# Patient Record
Sex: Female | Born: 2000 | Hispanic: No | Marital: Single | Smoking: Never smoker
Health system: Southern US, Community
[De-identification: ages and names within clinical notes are randomized; demographics above are authoritative.]

## PROBLEM LIST (undated history)

## (undated) DIAGNOSIS — Z789 Other specified health status: Secondary | ICD-10-CM

## (undated) HISTORY — PX: WISDOM TOOTH EXTRACTION: SHX21

---
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
## 2020-03-18 DIAGNOSIS — F1211 Cannabis abuse, in remission: Secondary | ICD-10-CM | POA: Diagnosis not present

## 2020-03-26 NOTE — L&D Delivery Note (Signed)
OB/GYN Faculty Practice Delivery Note  Carla Jones is a 20 y.o. G1P1001 s/p SVD at [redacted]w[redacted]d. She was admitted for IOL due to DFM.   ROM: 10h 39m with clear fluid GBS Status: Negative   Delivery Date/Time: 01/17/21 at 2300  Delivery: Called to room and patient was complete and pushing. Head delivered ROA. Nuchal cord present and reduced at the perineum. A shoulder dystocia was encountered and no additional traction was placed on the infant's head. McRobert's maneuver and suprapubic pressure were performed followed by rotational maneuvers which allowed the anterior shoulder to be released. Shoulders and body subsequently delivered in usual fashion. Infant placed on mother's abdomen, dried and stimulated. Cord clamped x 2 after 30 second delay, and cut by FOB under my direct supervision. Cord blood drawn. Placenta delivered spontaneously with gentle cord traction. Fundus firm with massage and Pitocin. Labia, perineum, vagina, and cervix were inspected, and patient was found to have small vaginal abrasions that were hemostatic and not repaired.   Placenta: Intact; 3VC - sent to L&D Complications: Shoulder dystocia Lacerations: Vaginal abrasions EBL: 575 cc Analgesia: Epidural  Infant: Viable female  APGARs 5 and 8  Evalina Field, MD OB/GYN Fellow, Faculty Practice

## 2020-04-01 DIAGNOSIS — F1211 Cannabis abuse, in remission: Secondary | ICD-10-CM | POA: Diagnosis not present

## 2020-04-05 DIAGNOSIS — R300 Dysuria: Secondary | ICD-10-CM | POA: Diagnosis not present

## 2020-04-09 DIAGNOSIS — F1211 Cannabis abuse, in remission: Secondary | ICD-10-CM | POA: Diagnosis not present

## 2020-05-04 DIAGNOSIS — Y999 Unspecified external cause status: Secondary | ICD-10-CM | POA: Diagnosis not present

## 2020-05-04 DIAGNOSIS — S20212A Contusion of left front wall of thorax, initial encounter: Secondary | ICD-10-CM | POA: Diagnosis not present

## 2020-05-04 DIAGNOSIS — R0781 Pleurodynia: Secondary | ICD-10-CM | POA: Diagnosis not present

## 2020-05-04 DIAGNOSIS — Z3202 Encounter for pregnancy test, result negative: Secondary | ICD-10-CM | POA: Diagnosis not present

## 2020-05-04 DIAGNOSIS — M546 Pain in thoracic spine: Secondary | ICD-10-CM | POA: Diagnosis not present

## 2020-05-04 DIAGNOSIS — W182XXA Fall in (into) shower or empty bathtub, initial encounter: Secondary | ICD-10-CM | POA: Diagnosis not present

## 2020-05-07 DIAGNOSIS — F1211 Cannabis abuse, in remission: Secondary | ICD-10-CM | POA: Diagnosis not present

## 2020-05-13 DIAGNOSIS — F1211 Cannabis abuse, in remission: Secondary | ICD-10-CM | POA: Diagnosis not present

## 2020-05-24 DIAGNOSIS — R102 Pelvic and perineal pain: Secondary | ICD-10-CM | POA: Diagnosis not present

## 2020-05-24 DIAGNOSIS — R319 Hematuria, unspecified: Secondary | ICD-10-CM | POA: Diagnosis not present

## 2020-05-24 DIAGNOSIS — M549 Dorsalgia, unspecified: Secondary | ICD-10-CM | POA: Diagnosis not present

## 2020-05-26 DIAGNOSIS — R55 Syncope and collapse: Secondary | ICD-10-CM | POA: Diagnosis not present

## 2020-05-26 DIAGNOSIS — O26891 Other specified pregnancy related conditions, first trimester: Secondary | ICD-10-CM | POA: Diagnosis not present

## 2020-05-26 DIAGNOSIS — R197 Diarrhea, unspecified: Secondary | ICD-10-CM | POA: Diagnosis not present

## 2020-05-26 DIAGNOSIS — O219 Vomiting of pregnancy, unspecified: Secondary | ICD-10-CM | POA: Diagnosis not present

## 2020-05-26 DIAGNOSIS — Z20822 Contact with and (suspected) exposure to covid-19: Secondary | ICD-10-CM | POA: Diagnosis not present

## 2020-05-26 DIAGNOSIS — E860 Dehydration: Secondary | ICD-10-CM | POA: Diagnosis not present

## 2020-05-26 DIAGNOSIS — R1030 Lower abdominal pain, unspecified: Secondary | ICD-10-CM | POA: Diagnosis not present

## 2020-05-26 DIAGNOSIS — E876 Hypokalemia: Secondary | ICD-10-CM | POA: Diagnosis not present

## 2020-05-26 DIAGNOSIS — O99281 Endocrine, nutritional and metabolic diseases complicating pregnancy, first trimester: Secondary | ICD-10-CM | POA: Diagnosis not present

## 2020-05-26 DIAGNOSIS — Z3A01 Less than 8 weeks gestation of pregnancy: Secondary | ICD-10-CM | POA: Diagnosis not present

## 2020-06-01 DIAGNOSIS — N912 Amenorrhea, unspecified: Secondary | ICD-10-CM | POA: Diagnosis not present

## 2020-06-01 DIAGNOSIS — R102 Pelvic and perineal pain: Secondary | ICD-10-CM | POA: Diagnosis not present

## 2020-06-01 DIAGNOSIS — Z3201 Encounter for pregnancy test, result positive: Secondary | ICD-10-CM | POA: Diagnosis not present

## 2020-06-03 DIAGNOSIS — F1211 Cannabis abuse, in remission: Secondary | ICD-10-CM | POA: Diagnosis not present

## 2020-06-11 DIAGNOSIS — F1211 Cannabis abuse, in remission: Secondary | ICD-10-CM | POA: Diagnosis not present

## 2020-06-16 DIAGNOSIS — O26841 Uterine size-date discrepancy, first trimester: Secondary | ICD-10-CM | POA: Diagnosis not present

## 2020-06-16 DIAGNOSIS — Z3401 Encounter for supervision of normal first pregnancy, first trimester: Secondary | ICD-10-CM | POA: Diagnosis not present

## 2020-06-16 DIAGNOSIS — Z3490 Encounter for supervision of normal pregnancy, unspecified, unspecified trimester: Secondary | ICD-10-CM | POA: Diagnosis not present

## 2020-06-16 DIAGNOSIS — Z3A09 9 weeks gestation of pregnancy: Secondary | ICD-10-CM | POA: Diagnosis not present

## 2020-06-17 DIAGNOSIS — F1211 Cannabis abuse, in remission: Secondary | ICD-10-CM | POA: Diagnosis not present

## 2020-06-24 DIAGNOSIS — F1211 Cannabis abuse, in remission: Secondary | ICD-10-CM | POA: Diagnosis not present

## 2020-06-28 DIAGNOSIS — Z5181 Encounter for therapeutic drug level monitoring: Secondary | ICD-10-CM | POA: Diagnosis not present

## 2020-07-01 ENCOUNTER — Emergency Department (HOSPITAL_COMMUNITY)
Admission: EM | Admit: 2020-07-01 | Discharge: 2020-07-01 | Disposition: A | Discharge status: Home / Self Care | Payer: Medicaid Other | Attending: Emergency Medicine | Admitting: Emergency Medicine

## 2020-07-01 ENCOUNTER — Encounter (HOSPITAL_COMMUNITY): Payer: Self-pay

## 2020-07-01 ENCOUNTER — Other Ambulatory Visit: Payer: Self-pay

## 2020-07-01 DIAGNOSIS — O99281 Endocrine, nutritional and metabolic diseases complicating pregnancy, first trimester: Secondary | ICD-10-CM | POA: Insufficient documentation

## 2020-07-01 DIAGNOSIS — E876 Hypokalemia: Secondary | ICD-10-CM | POA: Diagnosis not present

## 2020-07-01 DIAGNOSIS — E860 Dehydration: Secondary | ICD-10-CM | POA: Insufficient documentation

## 2020-07-01 DIAGNOSIS — R000 Tachycardia, unspecified: Secondary | ICD-10-CM | POA: Diagnosis not present

## 2020-07-01 DIAGNOSIS — O210 Mild hyperemesis gravidarum: Secondary | ICD-10-CM | POA: Diagnosis not present

## 2020-07-01 DIAGNOSIS — Z3A11 11 weeks gestation of pregnancy: Secondary | ICD-10-CM | POA: Diagnosis not present

## 2020-07-01 DIAGNOSIS — O219 Vomiting of pregnancy, unspecified: Secondary | ICD-10-CM | POA: Diagnosis not present

## 2020-07-01 LAB — URINALYSIS, ROUTINE W REFLEX MICROSCOPIC
Bilirubin Urine: NEGATIVE
Glucose, UA: NEGATIVE mg/dL
Hgb urine dipstick: NEGATIVE
Ketones, ur: 80 mg/dL — AB
Leukocytes,Ua: NEGATIVE
Nitrite: NEGATIVE
Protein, ur: NEGATIVE mg/dL
Specific Gravity, Urine: 1.024 (ref 1.005–1.030)
pH: 7 (ref 5.0–8.0)

## 2020-07-01 LAB — COMPREHENSIVE METABOLIC PANEL
ALT: 19 U/L (ref 0–44)
AST: 19 U/L (ref 15–41)
Albumin: 4.4 g/dL (ref 3.5–5.0)
Alkaline Phosphatase: 36 U/L — ABNORMAL LOW (ref 38–126)
Anion gap: 12 (ref 5–15)
BUN: 6 mg/dL (ref 6–20)
CO2: 18 mmol/L — ABNORMAL LOW (ref 22–32)
Calcium: 9.7 mg/dL (ref 8.9–10.3)
Chloride: 103 mmol/L (ref 98–111)
Creatinine, Ser: 0.68 mg/dL (ref 0.44–1.00)
GFR, Estimated: >60 mL/min (ref >60)
Glucose, Bld: 88 mg/dL (ref 70–99)
Potassium: 2.9 mmol/L — ABNORMAL LOW (ref 3.5–5.1)
Sodium: 133 mmol/L — ABNORMAL LOW (ref 135–145)
Total Bilirubin: 0.6 mg/dL (ref 0.3–1.2)
Total Protein: 8.2 g/dL — ABNORMAL HIGH (ref 6.5–8.1)

## 2020-07-01 LAB — RAPID URINE DRUG SCREEN, HOSP PERFORMED
Amphetamines: NOT DETECTED
Barbiturates: NOT DETECTED
Benzodiazepines: NOT DETECTED
Cocaine: NOT DETECTED
Opiates: NOT DETECTED
Tetrahydrocannabinol: POSITIVE — AB

## 2020-07-01 LAB — CBC WITH DIFFERENTIAL/PLATELET
Abs Immature Granulocytes: 0.02 10*3/uL (ref 0.00–0.07)
Basophils Absolute: 0 10*3/uL (ref 0.0–0.1)
Basophils Relative: 1 %
Eosinophils Absolute: 0 10*3/uL (ref 0.0–0.5)
Eosinophils Relative: 0 %
HCT: 37.9 % (ref 36.0–46.0)
Hemoglobin: 12.9 g/dL (ref 12.0–15.0)
Immature Granulocytes: 0 %
Lymphocytes Relative: 8 %
Lymphs Abs: 0.5 10*3/uL — ABNORMAL LOW (ref 0.7–4.0)
MCH: 30.4 pg (ref 26.0–34.0)
MCHC: 34 g/dL (ref 30.0–36.0)
MCV: 89.4 fL (ref 80.0–100.0)
Monocytes Absolute: 0.4 10*3/uL (ref 0.1–1.0)
Monocytes Relative: 6 %
Neutro Abs: 5.4 10*3/uL (ref 1.7–7.7)
Neutrophils Relative %: 85 %
Platelets: 211 10*3/uL (ref 150–400)
RBC: 4.24 MIL/uL (ref 3.87–5.11)
RDW: 12.7 % (ref 11.5–15.5)
WBC: 6.4 10*3/uL (ref 4.0–10.5)
nRBC: 0 % (ref 0.0–0.2)

## 2020-07-01 LAB — LIPASE, BLOOD: Lipase: 24 U/L (ref 11–51)

## 2020-07-01 MED ORDER — POTASSIUM CHLORIDE CRYS ER 20 MEQ PO TBCR
40.0000 meq | EXTENDED_RELEASE_TABLET | Freq: Once | ORAL | Status: AC
  Administered 2020-07-01: 40 meq via ORAL
  Filled 2020-07-01: qty 2

## 2020-07-01 MED ORDER — DIPHENHYDRAMINE HCL 50 MG/ML IJ SOLN
25.0000 mg | Freq: Once | INTRAMUSCULAR | Status: AC
  Administered 2020-07-01: 25 mg via INTRAVENOUS
  Filled 2020-07-01: qty 1

## 2020-07-01 MED ORDER — LACTATED RINGERS IV BOLUS
1000.0000 mL | Freq: Once | INTRAVENOUS | Status: AC
  Administered 2020-07-01: 1000 mL via INTRAVENOUS

## 2020-07-01 MED ORDER — SODIUM CHLORIDE 0.9 % IV BOLUS: 1000.0000 mL | Freq: Once | INTRAVENOUS | Status: DC

## 2020-07-01 MED ORDER — METOCLOPRAMIDE HCL 5 MG/ML IJ SOLN
10.0000 mg | Freq: Once | INTRAMUSCULAR | Status: AC
  Administered 2020-07-01: 10 mg via INTRAVENOUS
  Filled 2020-07-01: qty 2

## 2020-07-01 MED ORDER — ONDANSETRON HCL 4 MG/2ML IJ SOLN
4.0000 mg | Freq: Once | INTRAMUSCULAR | Status: AC
  Administered 2020-07-01: 4 mg via INTRAVENOUS
  Filled 2020-07-01: qty 2

## 2020-07-01 MED ORDER — POTASSIUM CHLORIDE 10 MEQ/100ML IV SOLN
10.0000 meq | INTRAVENOUS | Status: AC
  Administered 2020-07-01: 10 meq via INTRAVENOUS
  Filled 2020-07-01: qty 100

## 2020-07-01 MED ORDER — METOCLOPRAMIDE HCL 10 MG PO TABS: 10.0000 mg | ORAL_TABLET | Freq: Four times a day (QID) | ORAL | 0 refills | Status: DC | PRN

## 2020-07-01 NOTE — Discharge Instructions (Signed)
Your potassium is low likely from vomiting.  Please stay hydrated.  Take Reglan as needed for vomiting  Take your prenatal vitamins.   See your OB doctor  Return to the ER if you have worse vomiting, abdominal pain, vaginal bleeding.

## 2020-07-01 NOTE — ED Triage Notes (Signed)
Coming from home, [redacted] weeks pregnant, vomiting for days

## 2020-07-01 NOTE — ED Provider Notes (Signed)
[facility]-EMERGENCY DEPT Provider Note   CSN: [id number] Arrival date & time: 07/01/20  1853     History Chief Complaint  Patient presents with  . Emesis    Tenlee Tyeshia Cornforth is a 20 y.o. female about [redacted] weeks pregnant here presenting with vomiting.  Patient started vomiting for the last several days.  Patient denies any vaginal bleeding or discharge.  Patient was seen by OB recently had an ultrasound that was unremarkable.  Patient actually has been having intermittent vomiting for the last several months and was in the process of seeing a GI doctor.  Patient is not currently on any medicines for vomiting  The history is provided by the patient.       History reviewed. No pertinent past medical history.  There are no problems to display for this patient.   History reviewed. No pertinent surgical history.   OB History    Gravida  1   Para      Term      Preterm      AB      Living        SAB      IAB      Ectopic      Multiple      Live Births              No family history on file.  Social History   Tobacco Use  . Smoking status: Never Smoker  . Smokeless tobacco: Never Used  Substance Use Topics  . Alcohol use: Never  . Drug use: Never    Home Medications Prior to Admission medications   Not on File    Allergies    Patient has no known allergies.  Review of Systems   Review of Systems  Gastrointestinal: Positive for vomiting.  All other systems reviewed and are negative.   Physical Exam Updated Vital Signs BP 122/69   Pulse (!) 102   Temp 98 F (36.7 C) (Oral)   Resp 16   Ht 5\' 5"  (1.651 m)   Wt 57.2 kg   LMP 10/26/2019   SpO2 100%   BMI 20.97 kg/m   Physical Exam Vitals and nursing note reviewed.  Constitutional:      Comments: Vomiting, dehydrated   HENT:     Head: Normocephalic.     Nose: Nose normal.     Mouth/Throat:     Mouth: Mucous membranes are dry.  Eyes:     Extraocular  Movements: Extraocular movements intact.     Pupils: Pupils are equal, round, and reactive to light.  Cardiovascular:     Rate and Rhythm: Normal rate and regular rhythm.     Pulses: Normal pulses.     Heart sounds: Normal heart sounds.  Pulmonary:     Effort: Pulmonary effort is normal.     Breath sounds: Normal breath sounds.  Abdominal:     General: Abdomen is flat.     Palpations: Abdomen is soft.  Musculoskeletal:        General: Normal range of motion.     Cervical back: Normal range of motion.  Skin:    General: Skin is warm.     Capillary Refill: Capillary refill takes less than 2 seconds.  Neurological:     General: No focal deficit present.     Mental Status: She is oriented to person, place, and time.  Psychiatric:        Mood and Affect: Mood normal.  Behavior: Behavior normal.     ED Results / Procedures / Treatments   Labs (all labs ordered are listed, but only abnormal results are displayed) Labs Reviewed  CBC WITH DIFFERENTIAL/PLATELET  COMPREHENSIVE METABOLIC PANEL  LIPASE, BLOOD    EKG EKG Interpretation  Date/Time:  Friday July 01 2020 19:08:25 EDT Ventricular Rate:  110 PR Interval:  103 QRS Duration: 78 QT Interval:  332 QTC Calculation: 450 R Axis:   54 Text Interpretation: Sinus tachycardia Borderline repolarization abnormality Since last tracing rate faster Confirmed by Richardean Canal [phone]) on 07/01/2020 7:10:51 PM   Radiology No results found.  Procedures Procedures   Medications Ordered in ED Medications  lactated ringers bolus 1,000 mL (has no administration in time range)  ondansetron (ZOFRAN) injection 4 mg (has no administration in time range)    ED Course  I have reviewed the triage vital signs and the nursing notes.  Pertinent labs & imaging results that were available during my care of the patient were reviewed by me and considered in my medical decision making (see chart for details).    MDM  Rules/Calculators/A&P                         Mackayla Rafeef Lau is a 20 y.o. female who presented with vomiting.  Patient is [redacted] weeks pregnant.  Patient likely has hyperemesis gravidarum.  Patient also has chronic vomiting so I wonder if he has cyclic vomiting syndrome  11:04 PM Patient potassium is 2.9 and supplemented.  Patient's UA showed ketones and also marijuana.  Patient is able to tolerate p.o. after getting Reglan.  Told her to stay hydrated and take Reglan as needed and follow-up with her OB doctor.    Final Clinical Impression(s) / ED Diagnoses Final diagnoses:  None    Rx / DC Orders ED Discharge Orders    None       Charlynne Pander, MD 07/01/20 2305

## 2020-07-04 ENCOUNTER — Inpatient Hospital Stay (HOSPITAL_COMMUNITY)
Admission: AD | Admit: 2020-07-04 | Discharge: 2020-07-04 | Disposition: A | Discharge status: Home / Self Care | Payer: Medicaid Other | Attending: Family Medicine | Admitting: Family Medicine

## 2020-07-04 ENCOUNTER — Encounter (HOSPITAL_COMMUNITY): Payer: Self-pay | Admitting: Family Medicine

## 2020-07-04 ENCOUNTER — Other Ambulatory Visit: Payer: Self-pay

## 2020-07-04 DIAGNOSIS — Z049 Encounter for examination and observation for unspecified reason: Secondary | ICD-10-CM | POA: Diagnosis present

## 2020-07-04 DIAGNOSIS — Z20822 Contact with and (suspected) exposure to covid-19: Secondary | ICD-10-CM | POA: Diagnosis not present

## 2020-07-04 DIAGNOSIS — O26891 Other specified pregnancy related conditions, first trimester: Secondary | ICD-10-CM | POA: Diagnosis not present

## 2020-07-04 DIAGNOSIS — Z3A12 12 weeks gestation of pregnancy: Secondary | ICD-10-CM | POA: Diagnosis not present

## 2020-07-04 DIAGNOSIS — R0981 Nasal congestion: Secondary | ICD-10-CM | POA: Insufficient documentation

## 2020-07-04 LAB — SARS CORONAVIRUS 2 (TAT 6-24 HRS): SARS Coronavirus 2: NEGATIVE

## 2020-07-04 NOTE — MAU Note (Signed)
Pt will be called with positive results

## 2020-07-04 NOTE — MAU Provider Note (Signed)
Event Date/Time   First Provider Initiated Contact with Patient 07/04/20 1249      S Carla Jones is a 20 y.o. G1P0 patient who presents to MAU today with complaint of nasal congestion and sore throat. She is concerned about COVID.   O BP 139/78 (BP Location: Right Arm)   Pulse (!) 103   Temp 98.3 F (36.8 C) (Oral)   Resp 17   Ht 5\' 5"  (1.651 m)   Wt 56.2 kg   LMP 10/26/2019   SpO2 100%   BMI 20.62 kg/m  Physical Exam Vitals reviewed.  Constitutional:      Appearance: She is well-developed.  Cardiovascular:     Rate and Rhythm: Normal rate and regular rhythm.     Heart sounds: Normal heart sounds.  Pulmonary:     Effort: Pulmonary effort is normal.     Breath sounds: Normal breath sounds.  Abdominal:     Palpations: Abdomen is soft.  Neurological:     Mental Status: She is alert.  Psychiatric:        Mood and Affect: Mood normal.        Speech: Speech normal.        Behavior: Behavior normal.     A Medical screening exam complete Nasal congestion  P Check COVID.  Flonase and mucinex DM. Discharge from MAU in stable condition Warning signs for worsening condition that would warrant emergency follow-up discussed Patient may return to MAU as needed   12/26/2019, DO 07/04/2020 12:52 PM

## 2020-07-04 NOTE — MAU Note (Signed)
?   Flu symptoms, just wanted to come in and check. Cough, runny nose, headache, body aches, sore throat, fever(did not check). Loss of appetite, chills.  Started 2 days ago, started with HA when leaving the hospital (seen for n/v).Denies loss of taste or sense of smell.  Neg home covid test yesteday.. has taken Tylenol sinus, robitussin, and regular tylenol

## 2020-07-04 NOTE — Discharge Instructions (Signed)
Start flonase and mucinex DM We will notify you of + COVID.  Thank you for enrolling in MyChart. Please follow the instructions below to securely access your online medical record. MyChart allows you to send messages to your doctor, view your test results, manage appointments, and more.   How Do I Sign Up? 1. In your Internet browser, go to [employer] and enter [email or web address]. 2. Click on the Sign Up Now link in the Sign In box. You will see the New Member Sign Up page. 3. Enter your MyChart Access Code exactly as it appears below. You will not need to use this code after you've completed the sign-up process. If you do not sign up before the expiration date, you must request a new code.  MyChart Access Code: [email or web address] Expires: 08/15/2020 11:07 PM  4. Enter your Social Security Number ([id number]) and Date of Birth (mm/dd/yyyy) as indicated and click Submit. You will be taken to the next sign-up page. 5. Create a MyChart ID. This will be your MyChart login ID and cannot be changed, so think of one that is secure and easy to remember. 6. Create a MyChart password. You can change your password at any time. 7. Enter your Password Reset Question and Answer. This can be used at a later time if you forget your password.  8. Enter your e-mail address. You will receive e-mail notification when new information is available in MyChart. 9. Click Sign Up. You can now view your medical record.   Additional Information Remember, MyChart is NOT to be used for urgent needs. For medical emergencies, dial 911.

## 2020-07-08 DIAGNOSIS — F1211 Cannabis abuse, in remission: Secondary | ICD-10-CM | POA: Diagnosis not present

## 2020-07-14 DIAGNOSIS — Z3402 Encounter for supervision of normal first pregnancy, second trimester: Secondary | ICD-10-CM | POA: Diagnosis not present

## 2020-07-15 DIAGNOSIS — F1211 Cannabis abuse, in remission: Secondary | ICD-10-CM | POA: Diagnosis not present

## 2020-07-26 DIAGNOSIS — Z5181 Encounter for therapeutic drug level monitoring: Secondary | ICD-10-CM | POA: Diagnosis not present

## 2020-07-29 DIAGNOSIS — F1211 Cannabis abuse, in remission: Secondary | ICD-10-CM | POA: Diagnosis not present

## 2020-08-08 DIAGNOSIS — Z3402 Encounter for supervision of normal first pregnancy, second trimester: Secondary | ICD-10-CM | POA: Diagnosis not present

## 2020-08-08 DIAGNOSIS — Z5181 Encounter for therapeutic drug level monitoring: Secondary | ICD-10-CM | POA: Diagnosis not present

## 2020-08-23 DIAGNOSIS — Z5181 Encounter for therapeutic drug level monitoring: Secondary | ICD-10-CM | POA: Diagnosis not present

## 2020-08-25 DIAGNOSIS — Z5181 Encounter for therapeutic drug level monitoring: Secondary | ICD-10-CM | POA: Diagnosis not present

## 2020-09-01 DIAGNOSIS — Z3A20 20 weeks gestation of pregnancy: Secondary | ICD-10-CM | POA: Diagnosis not present

## 2020-09-01 DIAGNOSIS — Z3402 Encounter for supervision of normal first pregnancy, second trimester: Secondary | ICD-10-CM | POA: Diagnosis not present

## 2020-09-08 DIAGNOSIS — Z5181 Encounter for therapeutic drug level monitoring: Secondary | ICD-10-CM | POA: Diagnosis not present

## 2020-09-16 DIAGNOSIS — F1211 Cannabis abuse, in remission: Secondary | ICD-10-CM | POA: Diagnosis not present

## 2020-10-07 DIAGNOSIS — F1211 Cannabis abuse, in remission: Secondary | ICD-10-CM | POA: Diagnosis not present

## 2020-10-21 DIAGNOSIS — F1211 Cannabis abuse, in remission: Secondary | ICD-10-CM | POA: Diagnosis not present

## 2020-10-27 DIAGNOSIS — Z3403 Encounter for supervision of normal first pregnancy, third trimester: Secondary | ICD-10-CM | POA: Diagnosis not present

## 2020-10-27 DIAGNOSIS — Z3402 Encounter for supervision of normal first pregnancy, second trimester: Secondary | ICD-10-CM | POA: Diagnosis not present

## 2020-10-27 DIAGNOSIS — Z3A28 28 weeks gestation of pregnancy: Secondary | ICD-10-CM | POA: Diagnosis not present

## 2020-10-31 ENCOUNTER — Other Ambulatory Visit: Payer: Self-pay

## 2020-10-31 ENCOUNTER — Inpatient Hospital Stay (HOSPITAL_COMMUNITY): Payer: Medicaid Other

## 2020-10-31 ENCOUNTER — Inpatient Hospital Stay (HOSPITAL_COMMUNITY)
Admission: AD | Admit: 2020-10-31 | Discharge: 2020-10-31 | Disposition: A | Discharge status: Home / Self Care | Payer: Medicaid Other | Attending: Family Medicine | Admitting: Family Medicine

## 2020-10-31 ENCOUNTER — Encounter (HOSPITAL_COMMUNITY): Payer: Self-pay | Admitting: Obstetrics & Gynecology

## 2020-10-31 DIAGNOSIS — O98513 Other viral diseases complicating pregnancy, third trimester: Secondary | ICD-10-CM | POA: Diagnosis not present

## 2020-10-31 DIAGNOSIS — U071 COVID-19: Secondary | ICD-10-CM | POA: Diagnosis not present

## 2020-10-31 DIAGNOSIS — J1282 Pneumonia due to coronavirus disease 2019: Secondary | ICD-10-CM | POA: Insufficient documentation

## 2020-10-31 DIAGNOSIS — R0602 Shortness of breath: Secondary | ICD-10-CM | POA: Diagnosis not present

## 2020-10-31 DIAGNOSIS — O212 Late vomiting of pregnancy: Secondary | ICD-10-CM | POA: Insufficient documentation

## 2020-10-31 DIAGNOSIS — O288 Other abnormal findings on antenatal screening of mother: Secondary | ICD-10-CM

## 2020-10-31 DIAGNOSIS — Z3A29 29 weeks gestation of pregnancy: Secondary | ICD-10-CM

## 2020-10-31 HISTORY — DX: Other specified health status: Z78.9

## 2020-10-31 LAB — URINALYSIS, ROUTINE W REFLEX MICROSCOPIC
Bilirubin Urine: NEGATIVE
Glucose, UA: NEGATIVE mg/dL
Hgb urine dipstick: NEGATIVE
Ketones, ur: 80 mg/dL — AB
Leukocytes,Ua: NEGATIVE
Nitrite: NEGATIVE
Protein, ur: NEGATIVE mg/dL
Specific Gravity, Urine: 1.02 (ref 1.005–1.030)
pH: 9 — ABNORMAL HIGH (ref 5.0–8.0)

## 2020-10-31 LAB — CBC
HCT: 33.5 % — ABNORMAL LOW (ref 36.0–46.0)
Hemoglobin: 10.9 g/dL — ABNORMAL LOW (ref 12.0–15.0)
MCH: 29.1 pg (ref 26.0–34.0)
MCHC: 32.5 g/dL (ref 30.0–36.0)
MCV: 89.6 fL (ref 80.0–100.0)
Platelets: 154 10*3/uL (ref 150–400)
RBC: 3.74 MIL/uL — ABNORMAL LOW (ref 3.87–5.11)
RDW: 12.4 % (ref 11.5–15.5)
WBC: 9.7 10*3/uL (ref 4.0–10.5)
nRBC: 0 % (ref 0.0–0.2)

## 2020-10-31 LAB — COMPREHENSIVE METABOLIC PANEL
ALT: 14 U/L (ref 0–44)
AST: 20 U/L (ref 15–41)
Albumin: 3.1 g/dL — ABNORMAL LOW (ref 3.5–5.0)
Alkaline Phosphatase: 59 U/L (ref 38–126)
Anion gap: 10 (ref 5–15)
BUN: 5 mg/dL — ABNORMAL LOW (ref 6–20)
CO2: 20 mmol/L — ABNORMAL LOW (ref 22–32)
Calcium: 9.2 mg/dL (ref 8.9–10.3)
Chloride: 105 mmol/L (ref 98–111)
Creatinine, Ser: 0.61 mg/dL (ref 0.44–1.00)
GFR, Estimated: >60 mL/min (ref >60)
Glucose, Bld: 84 mg/dL (ref 70–99)
Potassium: 3.5 mmol/L (ref 3.5–5.1)
Sodium: 135 mmol/L (ref 135–145)
Total Bilirubin: 0.6 mg/dL (ref 0.3–1.2)
Total Protein: 6.8 g/dL (ref 6.5–8.1)

## 2020-10-31 LAB — RESP PANEL BY RT-PCR (FLU A&B, COVID) ARPGX2
Influenza A by PCR: NEGATIVE
Influenza B by PCR: NEGATIVE
SARS Coronavirus 2 by RT PCR: POSITIVE — AB

## 2020-10-31 LAB — D-DIMER, QUANTITATIVE: D-Dimer, Quant: 1.48 ug/mL-FEU — ABNORMAL HIGH (ref 0.00–0.50)

## 2020-10-31 LAB — C-REACTIVE PROTEIN: CRP: 0.6 mg/dL (ref <1.0)

## 2020-10-31 MED ORDER — PANTOPRAZOLE SODIUM 40 MG IV SOLR
40.0000 mg | Freq: Once | INTRAVENOUS | Status: AC
  Administered 2020-10-31: 40 mg via INTRAVENOUS
  Filled 2020-10-31: qty 40

## 2020-10-31 MED ORDER — DEXAMETHASONE 6 MG PO TABS: 6.0000 mg | ORAL_TABLET | Freq: Every day | ORAL | 0 refills | Status: AC

## 2020-10-31 MED ORDER — NIRMATRELVIR/RITONAVIR (PAXLOVID)TABLET: 3.0000 | ORAL_TABLET | Freq: Two times a day (BID) | ORAL | 0 refills | Status: AC

## 2020-10-31 MED ORDER — LACTATED RINGERS IV BOLUS
1000.0000 mL | Freq: Once | INTRAVENOUS | Status: AC
  Administered 2020-10-31: 1000 mL via INTRAVENOUS

## 2020-10-31 MED ORDER — ONDANSETRON HCL 4 MG/2ML IJ SOLN
4.0000 mg | Freq: Once | INTRAMUSCULAR | Status: AC
  Administered 2020-10-31: 4 mg via INTRAVENOUS
  Filled 2020-10-31: qty 2

## 2020-10-31 NOTE — MAU Provider Note (Signed)
History     CSN: [id number]  Arrival date and time: 10/31/20 1547   Event Date/Time   First Provider Initiated Contact with Patient 10/31/20 1725      Chief Complaint  Patient presents with   Emesis   Generalized Body Aches   HPI This is a 20 year old G1, P0 at 29 weeks who presents with 12 hours of nausea, vomiting.  She has been intolerant of food and liquids and has been feeling weak with chest tightness, mild shortness of breath, body aches, fevers, chills.  Her boyfriend was diagnosed with COVID yesterday was having symptoms the day before.  She has not taken a test yet.  OB History     Gravida  1   Para      Term      Preterm      AB      Living         SAB      IAB      Ectopic      Multiple      Live Births              Past Medical History:  Diagnosis Date   Medical history non-contributory     Past Surgical History:  Procedure Laterality Date   WISDOM TOOTH EXTRACTION      History reviewed. No pertinent family history.  Social History   Tobacco Use   Smoking status: Never   Smokeless tobacco: Never  Substance Use Topics   Alcohol use: Never   Drug use: Never    Allergies: No Known Allergies  Medications Prior to Admission  Medication Sig Dispense Refill Last Dose   promethazine (PHENERGAN) 12.5 MG tablet Take 12.5 mg by mouth every 6 (six) hours as needed for nausea or vomiting.   10/31/2020   metoCLOPramide (REGLAN) 10 MG tablet Take 1 tablet (10 mg total) by mouth every 6 (six) hours as needed for nausea (nausea/headache). 10 tablet 0     Review of Systems  All other systems reviewed and are negative. Physical Exam   Blood pressure 113/67, pulse (!) 149, temperature 99.2 F (37.3 C), resp. rate (!) 22, last menstrual period 10/26/2019, SpO2 100 %.  Physical Exam Vitals reviewed.  Constitutional:      Appearance: Normal appearance.  HENT:     Head: Normocephalic and atraumatic.  Cardiovascular:     Rate and Rhythm:  Tachycardia present.     Pulses: Normal pulses.     Heart sounds: Normal heart sounds.  Pulmonary:     Effort: Pulmonary effort is normal.     Breath sounds: Normal breath sounds.  Abdominal:     General: Abdomen is flat.     Palpations: Abdomen is soft.  Skin:    Capillary Refill: Capillary refill takes less than 2 seconds.  Neurological:     General: No focal deficit present.     Mental Status: She is alert.  Psychiatric:        Mood and Affect: Mood normal.        Behavior: Behavior normal.        Thought Content: Thought content normal.   Results for orders placed or performed during the hospital encounter of 10/31/20 (from the past 24 hour(s))  Urinalysis, Routine w reflex microscopic Urine, Clean Catch     Status: Abnormal   Collection Time: 10/31/20  4:24 PM  Result Value Ref Range   Color, Urine YELLOW YELLOW   APPearance HAZY (A) CLEAR  Specific Gravity, Urine 1.020 1.005 - 1.030   pH 9.0 (H) 5.0 - 8.0   Glucose, UA NEGATIVE NEGATIVE mg/dL   Hgb urine dipstick NEGATIVE NEGATIVE   Bilirubin Urine NEGATIVE NEGATIVE   Ketones, ur 80 (A) NEGATIVE mg/dL   Protein, ur NEGATIVE NEGATIVE mg/dL   Nitrite NEGATIVE NEGATIVE   Leukocytes,Ua NEGATIVE NEGATIVE  Resp Panel by RT-PCR (Flu A&B, Covid) Nasopharyngeal Swab     Status: Abnormal   Collection Time: 10/31/20  4:45 PM   Specimen: Nasopharyngeal Swab; Nasopharyngeal(NP) swabs in vial transport medium  Result Value Ref Range   SARS Coronavirus 2 by RT PCR POSITIVE (A) NEGATIVE   Influenza A by PCR NEGATIVE NEGATIVE   Influenza B by PCR NEGATIVE NEGATIVE  CBC     Status: Abnormal   Collection Time: 10/31/20  5:17 PM  Result Value Ref Range   WBC 9.7 4.0 - 10.5 K/uL   RBC 3.74 (L) 3.87 - 5.11 MIL/uL   Hemoglobin 10.9 (L) 12.0 - 15.0 g/dL   HCT 16.1 (L) 09.6 - 04.5 %   MCV 89.6 80.0 - 100.0 fL   MCH 29.1 26.0 - 34.0 pg   MCHC 32.5 30.0 - 36.0 g/dL   RDW 40.9 81.1 - 91.4 %   Platelets 154 150 - 400 K/uL   nRBC 0.0  0.0 - 0.2 %  Comprehensive metabolic panel     Status: Abnormal   Collection Time: 10/31/20  5:17 PM  Result Value Ref Range   Sodium 135 135 - 145 mmol/L   Potassium 3.5 3.5 - 5.1 mmol/L   Chloride 105 98 - 111 mmol/L   CO2 20 (L) 22 - 32 mmol/L   Glucose, Bld 84 70 - 99 mg/dL   BUN 5 (L) 6 - 20 mg/dL   Creatinine, Ser 7.82 0.44 - 1.00 mg/dL   Calcium 9.2 8.9 - 95.6 mg/dL   Total Protein 6.8 6.5 - 8.1 g/dL   Albumin 3.1 (L) 3.5 - 5.0 g/dL   AST 20 15 - 41 U/L   ALT 14 0 - 44 U/L   Alkaline Phosphatase 59 38 - 126 U/L   Total Bilirubin 0.6 0.3 - 1.2 mg/dL   GFR, Estimated >21 >30 mL/min   Anion gap 10 5 - 15  C-reactive protein     Status: None   Collection Time: 10/31/20  5:17 PM  Result Value Ref Range   CRP 0.6 <1.0 mg/dL  D-dimer, quantitative     Status: Abnormal   Collection Time: 10/31/20  5:17 PM  Result Value Ref Range   D-Dimer, Quant 1.48 (H) 0.00 - 0.50 ug/mL-FEU    MAU Course  Procedures NST: Baseline 150, mod variability, + accels, neg decels   MDM 2L IVF given, zofran. Heartrate improved but still mildly tachycardic. Patient tolerating liquids. SOB improved.  CXR shows infiltrates suggestive of viral pneumonia.  We will start the patient steroids and Paxlovid. Stable to discharge to home.  Assessment and Plan   1. [redacted] weeks gestation of pregnancy   2. SOB (shortness of breath)   3. Non-reactive NST (non-stress test)   4. Pneumonia due to COVID-19 virus    Start Paxlovid and dexamethasone.  Return precautions given. Counseled regarding quarantine. Other symptomatic treatments discussed.  Levie Heritage 10/31/2020, 5:25 PM

## 2020-10-31 NOTE — MAU Note (Signed)
Reports increased N/V. Taking promethazine but has not helped. Boyfriend Dx with covid 2 days ago. Pt reports body ache espcillly her back  , headache and chills and some nasal congestion and some SOB. Has not ben able to sleep well. Good fetal movement felt.

## 2020-11-04 ENCOUNTER — Inpatient Hospital Stay (HOSPITAL_COMMUNITY): Payer: Medicaid Other

## 2020-11-04 ENCOUNTER — Other Ambulatory Visit: Payer: Self-pay

## 2020-11-04 ENCOUNTER — Encounter (HOSPITAL_COMMUNITY): Payer: Self-pay | Admitting: Obstetrics & Gynecology

## 2020-11-04 ENCOUNTER — Inpatient Hospital Stay (HOSPITAL_COMMUNITY)
Admission: AD | Admit: 2020-11-04 | Discharge: 2020-11-04 | Disposition: A | Discharge status: Home / Self Care | Payer: Medicaid Other | Attending: Obstetrics & Gynecology | Admitting: Obstetrics & Gynecology

## 2020-11-04 DIAGNOSIS — O99891 Other specified diseases and conditions complicating pregnancy: Secondary | ICD-10-CM

## 2020-11-04 DIAGNOSIS — O98513 Other viral diseases complicating pregnancy, third trimester: Secondary | ICD-10-CM

## 2020-11-04 DIAGNOSIS — Z3689 Encounter for other specified antenatal screening: Secondary | ICD-10-CM

## 2020-11-04 DIAGNOSIS — Z3A29 29 weeks gestation of pregnancy: Secondary | ICD-10-CM | POA: Diagnosis not present

## 2020-11-04 DIAGNOSIS — R0782 Intercostal pain: Secondary | ICD-10-CM | POA: Diagnosis not present

## 2020-11-04 DIAGNOSIS — R0602 Shortness of breath: Secondary | ICD-10-CM | POA: Diagnosis not present

## 2020-11-04 DIAGNOSIS — U071 COVID-19: Secondary | ICD-10-CM

## 2020-11-04 DIAGNOSIS — O26893 Other specified pregnancy related conditions, third trimester: Secondary | ICD-10-CM | POA: Insufficient documentation

## 2020-11-04 LAB — CBC WITH DIFFERENTIAL/PLATELET
Abs Immature Granulocytes: 0.09 10*3/uL — ABNORMAL HIGH (ref 0.00–0.07)
Basophils Absolute: 0 10*3/uL (ref 0.0–0.1)
Basophils Relative: 0 %
Eosinophils Absolute: 0 10*3/uL (ref 0.0–0.5)
Eosinophils Relative: 0 %
HCT: 29.9 % — ABNORMAL LOW (ref 36.0–46.0)
Hemoglobin: 9.8 g/dL — ABNORMAL LOW (ref 12.0–15.0)
Immature Granulocytes: 1 %
Lymphocytes Relative: 10 %
Lymphs Abs: 1.1 10*3/uL (ref 0.7–4.0)
MCH: 29.4 pg (ref 26.0–34.0)
MCHC: 32.8 g/dL (ref 30.0–36.0)
MCV: 89.8 fL (ref 80.0–100.0)
Monocytes Absolute: 0.5 10*3/uL (ref 0.1–1.0)
Monocytes Relative: 5 %
Neutro Abs: 8.7 10*3/uL — ABNORMAL HIGH (ref 1.7–7.7)
Neutrophils Relative %: 84 %
Platelets: 164 10*3/uL (ref 150–400)
RBC: 3.33 MIL/uL — ABNORMAL LOW (ref 3.87–5.11)
RDW: 12.6 % (ref 11.5–15.5)
WBC: 10.4 10*3/uL (ref 4.0–10.5)
nRBC: 0 % (ref 0.0–0.2)

## 2020-11-04 LAB — COMPREHENSIVE METABOLIC PANEL
ALT: 24 U/L (ref 0–44)
AST: 28 U/L (ref 15–41)
Albumin: 3 g/dL — ABNORMAL LOW (ref 3.5–5.0)
Alkaline Phosphatase: 50 U/L (ref 38–126)
Anion gap: 9 (ref 5–15)
BUN: <5 mg/dL — ABNORMAL LOW (ref 6–20)
CO2: 21 mmol/L — ABNORMAL LOW (ref 22–32)
Calcium: 8.9 mg/dL (ref 8.9–10.3)
Chloride: 107 mmol/L (ref 98–111)
Creatinine, Ser: 0.57 mg/dL (ref 0.44–1.00)
GFR, Estimated: >60 mL/min (ref >60)
Glucose, Bld: 113 mg/dL — ABNORMAL HIGH (ref 70–99)
Potassium: 3.6 mmol/L (ref 3.5–5.1)
Sodium: 137 mmol/L (ref 135–145)
Total Bilirubin: <0.1 mg/dL — ABNORMAL LOW (ref 0.3–1.2)
Total Protein: 6.3 g/dL — ABNORMAL LOW (ref 6.5–8.1)

## 2020-11-04 LAB — URINALYSIS, ROUTINE W REFLEX MICROSCOPIC
Bilirubin Urine: NEGATIVE
Glucose, UA: 150 mg/dL — AB
Hgb urine dipstick: NEGATIVE
Ketones, ur: 5 mg/dL — AB
Leukocytes,Ua: NEGATIVE
Nitrite: NEGATIVE
Protein, ur: 30 mg/dL — AB
Specific Gravity, Urine: 1.024 (ref 1.005–1.030)
pH: 6 (ref 5.0–8.0)

## 2020-11-04 LAB — BRAIN NATRIURETIC PEPTIDE: B Natriuretic Peptide: 143.6 pg/mL — ABNORMAL HIGH (ref 0.0–100.0)

## 2020-11-04 LAB — TYPE AND SCREEN
ABO/RH(D): A POS
Antibody Screen: NEGATIVE

## 2020-11-04 LAB — TROPONIN I (HIGH SENSITIVITY): Troponin I (High Sensitivity): 7 ng/L (ref <18)

## 2020-11-04 MED ORDER — NIFEDIPINE 10 MG PO CAPS
10.0000 mg | ORAL_CAPSULE | ORAL | Status: DC | PRN
  Administered 2020-11-04: 10 mg via ORAL
  Filled 2020-11-04: qty 1

## 2020-11-04 MED ORDER — OXYCODONE HCL 5 MG PO TABS
10.0000 mg | ORAL_TABLET | Freq: Once | ORAL | Status: AC
  Administered 2020-11-04: 10 mg via ORAL
  Filled 2020-11-04: qty 2

## 2020-11-04 MED ORDER — HYDROCODONE-ACETAMINOPHEN 5-325 MG PO TABS: 2.0000 | ORAL_TABLET | ORAL | 0 refills | Status: AC | PRN

## 2020-11-04 MED ORDER — IOHEXOL 350 MG/ML SOLN
75.0000 mL | Freq: Once | INTRAVENOUS | Status: AC | PRN
  Administered 2020-11-04: 75 mL via INTRAVENOUS

## 2020-11-04 MED ORDER — OMEPRAZOLE MAGNESIUM 20 MG PO TBEC: 20.0000 mg | DELAYED_RELEASE_TABLET | Freq: Every day | ORAL | 0 refills | Status: DC

## 2020-11-04 MED ORDER — FAMOTIDINE 10 MG PO TABS: 10.0000 mg | ORAL_TABLET | Freq: Two times a day (BID) | ORAL | 0 refills | Status: DC

## 2020-11-04 MED ORDER — LACTATED RINGERS IV BOLUS
500.0000 mL | Freq: Once | INTRAVENOUS | Status: AC
  Administered 2020-11-04: 500 mL via INTRAVENOUS

## 2020-11-04 NOTE — MAU Note (Signed)
Carla Jones is a 20 y.o. at [redacted]w[redacted]d here in MAU reporting: states she had been doing well at home since covid diagnosis until this afternoon. Started having SOB and some chest tightness. Also reports lower abdominal pain. No bleeding or LOF. Denies fever.  Onset of complaint: today  Pain score: chest 6/10, abdominal 5/10  Vitals:   11/04/20 1434  BP: 119/75  Pulse: (!) 104  Resp: (!) 24  Temp: (!) 97.5 F (36.4 C)  SpO2: 100%     FHT: EFM applied in room   Lab orders placed from triage: UA

## 2020-11-04 NOTE — MAU Provider Note (Signed)
History     CSN: [id number]  Arrival date and time: 11/04/20 1354   Event Date/Time   First Provider Initiated Contact with Patient 11/04/20 1849      Chief Complaint  Patient presents with   Abdominal Pain   Shortness of Breath   Chest Pain   HPI Carla Jones is a 20 y.o. G1P0 at [redacted]w[redacted]d who presents to MAU with chief complaint of worsening SOB in the setting of recent COVID diagnosis. Patient was evaluated in MAU for chest tightness and SOB on 10/31/2020. She states she felt fine at time of discharge from that encounter. Last night, she experienced a return of tight chest and SOB. She also endorses irregular pain in her lower abdomen and the posterior aspect of the right side of her rib cage. She is compliant with Paxlovid and Decadron as prescribed on 10/31/2020. She has not taken any other medication or tried other treatments for that complaint. She denies palpitations, weakness, syncope. She denies contractions, vaginal bleeding, DFM, fever.   OB History     Gravida  1   Para      Term      Preterm      AB      Living         SAB      IAB      Ectopic      Multiple      Live Births              Past Medical History:  Diagnosis Date   Medical history non-contributory     Past Surgical History:  Procedure Laterality Date   WISDOM TOOTH EXTRACTION      History reviewed. No pertinent family history.  Social History   Tobacco Use   Smoking status: Never   Smokeless tobacco: Never  Substance Use Topics   Alcohol use: Never   Drug use: Never    Allergies: No Known Allergies  Medications Prior to Admission  Medication Sig Dispense Refill Last Dose   acetaminophen (TYLENOL) 500 MG tablet Take 500 mg by mouth every 6 (six) hours as needed for mild pain, fever or headache.   Past Week   Ascorbic Acid (VITAMIN C PO) Take 1 tablet by mouth daily.   11/03/2020   Cholecalciferol (D3 PO) Take 1 capsule by mouth daily.   11/03/2020    dexamethasone (DECADRON) 6 MG tablet Take 1 tablet (6 mg total) by mouth daily for 5 days. 5 tablet 0 11/04/2020   nirmatrelvir/ritonavir EUA (PAXLOVID) TABS Take 3 tablets by mouth 2 (two) times daily for 5 days. Patient GFR is >60. Take nirmatrelvir (150 mg) two tablets twice daily for 5 days and ritonavir (100 mg) one tablet twice daily for 5 days. 30 tablet 0 11/04/2020   Prenatal Vit-Fe Fumarate-FA (PRENATAL PO) Take 1 tablet by mouth daily.   11/03/2020   metoCLOPramide (REGLAN) 10 MG tablet Take 1 tablet (10 mg total) by mouth every 6 (six) hours as needed for nausea (nausea/headache). (Patient not taking: Reported on 11/04/2020) 10 tablet 0 Not Taking    Review of Systems  Constitutional:  Positive for fatigue. Negative for fever.  Respiratory:  Positive for cough and shortness of breath.   Cardiovascular:  Positive for chest pain.  Gastrointestinal:  Positive for abdominal pain.  Musculoskeletal:  Positive for back pain.  All other systems reviewed and are negative. Physical Exam   Blood pressure 131/73, pulse (!) 140, temperature (!) 97.5 F (36.4 C),  temperature source Oral, resp. rate (!) 24, last menstrual period 10/26/2019, SpO2 100 %.  Physical Exam Vitals and nursing note reviewed. Exam conducted with a chaperone present.  Constitutional:      Appearance: She is well-developed. She is ill-appearing.  Cardiovascular:     Rate and Rhythm: Regular rhythm. Tachycardia present.     Heart sounds: Normal heart sounds.  Pulmonary:     Effort: Pulmonary effort is normal.     Breath sounds: Normal breath sounds. No decreased breath sounds, wheezing, rhonchi or rales.     Comments: LCTAB, patient unable to speak in full sentences without stopping to breath. Satting 100% on RA, in High Fowler's Abdominal:     Palpations: Abdomen is soft.     Tenderness: There is no abdominal tenderness. There is no right CVA tenderness or left CVA tenderness.     Comments: Gravid  Skin:    General:  Skin is warm and dry.     Capillary Refill: Capillary refill takes less than 2 seconds.  Neurological:     Mental Status: She is alert and oriented to person, place, and time.  Psychiatric:        Mood and Affect: Mood normal.        Behavior: Behavior normal.    MAU Course  Procedures  --Chest Xray for current encounter shows improvement when compared to imaging from 10/31/2020. Discussed with Dr. Macon Large in context of RR 24-26. Dr. Gladis Riffle agrees CTA is warranted.  --Abnormal ECG. S/p phone consult with Dr. Izora Ribas. No additional intervention indicated at this time. Will reevaluate once patient recovers from COVID, ECHO PRN based on lingering symptoms  --Reactive tracing s/p fluid bolus. Baseline 155, mod var, + 10 x 10 accels, no decels --Toco: irregular ui  --Roxicodone ordered for worsening right posterior rib cage pain. Possible intercostal muscle strain or pleurisy. Discussed with Dr. Despina Hidden. Will rx Lortab for comfort management   Orders Placed This Encounter  Procedures   DG Chest Port 1 View   CT Angio Chest Pulmonary Embolism (PE) W or WO Contrast   Urinalysis, Routine w reflex microscopic Urine, Clean Catch   CBC with Differential/Platelet   Comprehensive metabolic panel   Brain natriuretic peptide   ED EKG   Type and screen [facility]   Insert peripheral IV   Discharge patient   Patient Vitals for the past 24 hrs:  BP Temp Temp src Pulse Resp SpO2  11/04/20 1923 130/61 -- -- (!) 111 (!) 24 100 %  11/04/20 1812 -- -- -- (!) 140 -- --  11/04/20 1733 131/73 -- -- (!) 106 -- --  11/04/20 1434 119/75 (!) 97.5 F (36.4 C) Oral (!) 104 (!) 24 100 %   Results for orders placed or performed during the hospital encounter of 11/04/20 (from the past 24 hour(s))  Urinalysis, Routine w reflex microscopic Urine, Clean Catch     Status: Abnormal   Collection Time: 11/04/20  2:45 PM  Result Value Ref Range   Color, Urine YELLOW YELLOW   APPearance  HAZY (A) CLEAR   Specific Gravity, Urine 1.024 1.005 - 1.030   pH 6.0 5.0 - 8.0   Glucose, UA 150 (A) NEGATIVE mg/dL   Hgb urine dipstick NEGATIVE NEGATIVE   Bilirubin Urine NEGATIVE NEGATIVE   Ketones, ur 5 (A) NEGATIVE mg/dL   Protein, ur 30 (A) NEGATIVE mg/dL   Nitrite NEGATIVE NEGATIVE   Leukocytes,Ua NEGATIVE NEGATIVE   RBC / HPF 0-5 0 - 5  RBC/hpf   WBC, UA 0-5 0 - 5 WBC/hpf   Bacteria, UA RARE (A) NONE SEEN   Squamous Epithelial / LPF 0-5 0 - 5   Mucus PRESENT   CBC with Differential/Platelet     Status: Abnormal   Collection Time: 11/04/20  3:12 PM  Result Value Ref Range   WBC 10.4 4.0 - 10.5 K/uL   RBC 3.33 (L) 3.87 - 5.11 MIL/uL   Hemoglobin 9.8 (L) 12.0 - 15.0 g/dL   HCT 42.3 (L) 53.6 - 14.4 %   MCV 89.8 80.0 - 100.0 fL   MCH 29.4 26.0 - 34.0 pg   MCHC 32.8 30.0 - 36.0 g/dL   RDW 31.5 40.0 - 86.7 %   Platelets 164 150 - 400 K/uL   nRBC 0.0 0.0 - 0.2 %   Neutrophils Relative % 84 %   Neutro Abs 8.7 (H) 1.7 - 7.7 K/uL   Lymphocytes Relative 10 %   Lymphs Abs 1.1 0.7 - 4.0 K/uL   Monocytes Relative 5 %   Monocytes Absolute 0.5 0.1 - 1.0 K/uL   Eosinophils Relative 0 %   Eosinophils Absolute 0.0 0.0 - 0.5 K/uL   Basophils Relative 0 %   Basophils Absolute 0.0 0.0 - 0.1 K/uL   Immature Granulocytes 1 %   Abs Immature Granulocytes 0.09 (H) 0.00 - 0.07 K/uL  Comprehensive metabolic panel     Status: Abnormal   Collection Time: 11/04/20  3:12 PM  Result Value Ref Range   Sodium 137 135 - 145 mmol/L   Potassium 3.6 3.5 - 5.1 mmol/L   Chloride 107 98 - 111 mmol/L   CO2 21 (L) 22 - 32 mmol/L   Glucose, Bld 113 (H) 70 - 99 mg/dL   BUN <5 (L) 6 - 20 mg/dL   Creatinine, Ser 6.19 0.44 - 1.00 mg/dL   Calcium 8.9 8.9 - 50.9 mg/dL   Total Protein 6.3 (L) 6.5 - 8.1 g/dL   Albumin 3.0 (L) 3.5 - 5.0 g/dL   AST 28 15 - 41 U/L   ALT 24 0 - 44 U/L   Alkaline Phosphatase 50 38 - 126 U/L   Total Bilirubin <0.1 (L) 0.3 - 1.2 mg/dL   GFR, Estimated >32 >67 mL/min   Anion gap  9 5 - 15  Brain natriuretic peptide     Status: Abnormal   Collection Time: 11/04/20  3:12 PM  Result Value Ref Range   B Natriuretic Peptide 143.6 (H) 0.0 - 100.0 pg/mL  Troponin I (High Sensitivity)     Status: None   Collection Time: 11/04/20  3:12 PM  Result Value Ref Range   Troponin I (High Sensitivity) 7 <18 ng/L  Type and screen [facility]     Status: None   Collection Time: 11/04/20  3:12 PM  Result Value Ref Range   ABO/RH(D) A POS    Antibody Screen NEG    Sample Expiration      11/07/2020,2359 Performed at [facility], [address], [place], [place] [address]    CT Angio Chest Pulmonary Embolism (PE) W or WO Contrast  Result Date: 11/04/2020 CLINICAL DATA:  Pregnant, chest discomfort, shortness of breath, current COVID-19 infection, signs and symptoms of DVT, question pulmonary embolism EXAM: CT ANGIOGRAPHY CHEST WITH CONTRAST TECHNIQUE: Multidetector CT imaging of the chest was performed using the standard protocol during bolus administration of intravenous contrast. Multiplanar CT image reconstructions and MIPs were obtained to evaluate the vascular anatomy. CONTRAST:  75mL OMNIPAQUE IOHEXOL 350 MG/ML SOLN IV COMPARISON:  None FINDINGS: Cardiovascular: Aorta normal caliber without aneurysm or dissection. Pulmonary arteries adequately opacified and patent. No evidence of pulmonary embolism. Slightly prominent cardiac size consistent with pregnancy. No pericardial effusion. Mediastinum/Nodes: Esophagus unremarkable. Base of cervical region normal appearance. No thoracic adenopathy. Lungs/Pleura: Lungs clear. No pulmonary infiltrate, pleural effusion, or pneumothorax. Upper Abdomen: Visualized upper abdomen unremarkable Musculoskeletal: Unremarkable Review of the MIP images confirms the above findings. IMPRESSION: Normal CT angio chest. Electronically Signed   By: Ulyses SouthwardMark  Boles M.D.   On: 11/04/2020 18:54   DG Chest Port 1 View  Result Date:  11/04/2020 CLINICAL DATA:  Shortness of breath EXAM: PORTABLE CHEST 1 VIEW COMPARISON:  Chest radiograph 10/31/2020 FINDINGS: The cardiomediastinal silhouette is stable. Previously seen patchy opacities in the right lung have resolved. There is no focal consolidation or pulmonary edema. There is no pleural effusion or pneumothorax. There is unchanged slight asymmetric elevation of the right hemidiaphragm. There is no acute osseous abnormality. IMPRESSION: Resolved patchy opacities in the right lung. No focal opacity or pleural effusion on the current study. Electronically Signed   By: Lesia HausenPeter  Noone M.D.   On: 11/04/2020 15:42    Meds ordered this encounter  Medications   lactated ringers bolus 500 mL   NIFEdipine (PROCARDIA) capsule 10 mg   iohexol (OMNIPAQUE) 350 MG/ML injection 75 mL   oxyCODONE (Oxy IR/ROXICODONE) immediate release tablet 10 mg   HYDROcodone-acetaminophen (NORCO/VICODIN) 5-325 MG tablet    Sig: Take 2 tablets by mouth every 4 (four) hours as needed for up to 3 days for severe pain.    Dispense:  30 tablet    Refill:  0    Order Specific Question:   Supervising Provider    Answer:   Duane LopeEURE, LUTHER H [[id number]]   omeprazole (PRILOSEC OTC) 20 MG tablet    Sig: Take 1 tablet (20 mg total) by mouth daily.    Dispense:  30 tablet    Refill:  0    Order Specific Question:   Supervising Provider    Answer:   Duane LopeEURE, LUTHER H [[id number]]   famotidine (PEPCID) 10 MG tablet    Sig: Take 1 tablet (10 mg total) by mouth 2 (two) times daily.    Dispense:  60 tablet    Refill:  0    Order Specific Question:   Supervising Provider    Answer:   Lazaro ArmsEURE, LUTHER H [[id number]]   Assessment and Plan  --20 y.o. G1P0 at 2366w4d  --COVID +, s/p repeat CXR and CTA --No new acute concerns during today's evaluation --Lortab for intercostal pain --Rx for reflux per patient request --Discharge home in stable condition with strict return precautions  Calvert CantorSamantha C Aarvi Stotts, CNM 11/04/2020, 8:06 PM

## 2020-11-04 NOTE — Discharge Instructions (Signed)

## 2020-11-24 DIAGNOSIS — F4310 Post-traumatic stress disorder, unspecified: Secondary | ICD-10-CM | POA: Diagnosis not present

## 2020-11-25 DIAGNOSIS — F1211 Cannabis abuse, in remission: Secondary | ICD-10-CM | POA: Diagnosis not present

## 2020-11-29 DIAGNOSIS — F4310 Post-traumatic stress disorder, unspecified: Secondary | ICD-10-CM | POA: Diagnosis not present

## 2020-12-07 DIAGNOSIS — F4310 Post-traumatic stress disorder, unspecified: Secondary | ICD-10-CM | POA: Diagnosis not present

## 2020-12-14 DIAGNOSIS — F4310 Post-traumatic stress disorder, unspecified: Secondary | ICD-10-CM | POA: Diagnosis not present

## 2020-12-19 DIAGNOSIS — R52 Pain, unspecified: Secondary | ICD-10-CM | POA: Diagnosis not present

## 2020-12-19 DIAGNOSIS — Z20822 Contact with and (suspected) exposure to covid-19: Secondary | ICD-10-CM | POA: Diagnosis not present

## 2020-12-19 DIAGNOSIS — J069 Acute upper respiratory infection, unspecified: Secondary | ICD-10-CM | POA: Diagnosis not present

## 2020-12-19 DIAGNOSIS — Z0184 Encounter for antibody response examination: Secondary | ICD-10-CM | POA: Diagnosis not present

## 2020-12-19 DIAGNOSIS — J0110 Acute frontal sinusitis, unspecified: Secondary | ICD-10-CM | POA: Diagnosis not present

## 2020-12-20 DIAGNOSIS — Z3A36 36 weeks gestation of pregnancy: Secondary | ICD-10-CM | POA: Diagnosis not present

## 2020-12-20 DIAGNOSIS — Z3403 Encounter for supervision of normal first pregnancy, third trimester: Secondary | ICD-10-CM | POA: Diagnosis not present

## 2020-12-20 DIAGNOSIS — O365930 Maternal care for other known or suspected poor fetal growth, third trimester, not applicable or unspecified: Secondary | ICD-10-CM | POA: Diagnosis not present

## 2020-12-26 DIAGNOSIS — Z3403 Encounter for supervision of normal first pregnancy, third trimester: Secondary | ICD-10-CM | POA: Diagnosis not present

## 2020-12-26 DIAGNOSIS — Z3685 Encounter for antenatal screening for Streptococcus B: Secondary | ICD-10-CM | POA: Diagnosis not present

## 2020-12-26 DIAGNOSIS — Z113 Encounter for screening for infections with a predominantly sexual mode of transmission: Secondary | ICD-10-CM | POA: Diagnosis not present

## 2020-12-26 DIAGNOSIS — Z3A37 37 weeks gestation of pregnancy: Secondary | ICD-10-CM | POA: Diagnosis not present

## 2020-12-30 DIAGNOSIS — F4310 Post-traumatic stress disorder, unspecified: Secondary | ICD-10-CM | POA: Diagnosis not present

## 2021-01-02 DIAGNOSIS — Z3403 Encounter for supervision of normal first pregnancy, third trimester: Secondary | ICD-10-CM | POA: Diagnosis not present

## 2021-01-02 DIAGNOSIS — Z3A38 38 weeks gestation of pregnancy: Secondary | ICD-10-CM | POA: Diagnosis not present

## 2021-01-02 LAB — OB RESULTS CONSOLE GC/CHLAMYDIA
Chlamydia: NEGATIVE
Gonorrhea: NEGATIVE

## 2021-01-05 DIAGNOSIS — F4310 Post-traumatic stress disorder, unspecified: Secondary | ICD-10-CM | POA: Diagnosis not present

## 2021-01-09 DIAGNOSIS — Z3A39 39 weeks gestation of pregnancy: Secondary | ICD-10-CM | POA: Diagnosis not present

## 2021-01-09 DIAGNOSIS — Z3403 Encounter for supervision of normal first pregnancy, third trimester: Secondary | ICD-10-CM | POA: Diagnosis not present

## 2021-01-13 DIAGNOSIS — F4310 Post-traumatic stress disorder, unspecified: Secondary | ICD-10-CM | POA: Diagnosis not present

## 2021-01-16 ENCOUNTER — Other Ambulatory Visit: Payer: Self-pay

## 2021-01-16 ENCOUNTER — Inpatient Hospital Stay (HOSPITAL_COMMUNITY): Admit: 2021-01-16 | Payer: Self-pay

## 2021-01-16 ENCOUNTER — Inpatient Hospital Stay (EMERGENCY_DEPARTMENT_HOSPITAL)
Admission: AD | Admit: 2021-01-16 | Discharge: 2021-01-16 | Disposition: A | Discharge status: Home / Self Care | Payer: Medicaid Other | Source: Home / Self Care | Attending: Obstetrics & Gynecology | Admitting: Obstetrics & Gynecology

## 2021-01-16 ENCOUNTER — Encounter (HOSPITAL_COMMUNITY): Payer: Self-pay | Admitting: Obstetrics and Gynecology

## 2021-01-16 ENCOUNTER — Inpatient Hospital Stay (HOSPITAL_COMMUNITY)
Admission: AD | Admit: 2021-01-16 | Discharge: 2021-01-19 | DRG: 806 | Disposition: A | Discharge status: Home / Self Care | Payer: Medicaid Other | Attending: Obstetrics and Gynecology | Admitting: Obstetrics and Gynecology

## 2021-01-16 ENCOUNTER — Encounter (HOSPITAL_COMMUNITY): Payer: Self-pay | Admitting: Obstetrics & Gynecology

## 2021-01-16 DIAGNOSIS — F419 Anxiety disorder, unspecified: Secondary | ICD-10-CM | POA: Diagnosis present

## 2021-01-16 DIAGNOSIS — O471 False labor at or after 37 completed weeks of gestation: Secondary | ICD-10-CM

## 2021-01-16 DIAGNOSIS — O365930 Maternal care for other known or suspected poor fetal growth, third trimester, not applicable or unspecified: Secondary | ICD-10-CM | POA: Diagnosis present

## 2021-01-16 DIAGNOSIS — O368130 Decreased fetal movements, third trimester, not applicable or unspecified: Secondary | ICD-10-CM | POA: Diagnosis not present

## 2021-01-16 DIAGNOSIS — Z3A40 40 weeks gestation of pregnancy: Secondary | ICD-10-CM

## 2021-01-16 DIAGNOSIS — O9081 Anemia of the puerperium: Secondary | ICD-10-CM | POA: Diagnosis not present

## 2021-01-16 DIAGNOSIS — F32A Depression, unspecified: Secondary | ICD-10-CM | POA: Diagnosis present

## 2021-01-16 DIAGNOSIS — D62 Acute posthemorrhagic anemia: Secondary | ICD-10-CM | POA: Diagnosis not present

## 2021-01-16 DIAGNOSIS — Z8759 Personal history of other complications of pregnancy, childbirth and the puerperium: Secondary | ICD-10-CM | POA: Diagnosis not present

## 2021-01-16 DIAGNOSIS — O479 False labor, unspecified: Secondary | ICD-10-CM

## 2021-01-16 DIAGNOSIS — O368131 Decreased fetal movements, third trimester, fetus 1: Secondary | ICD-10-CM | POA: Diagnosis not present

## 2021-01-16 LAB — TYPE AND SCREEN
ABO/RH(D): A POS
Antibody Screen: NEGATIVE

## 2021-01-16 LAB — CBC
HCT: 30.9 % — ABNORMAL LOW (ref 36.0–46.0)
Hemoglobin: 9.2 g/dL — ABNORMAL LOW (ref 12.0–15.0)
MCH: 23.7 pg — ABNORMAL LOW (ref 26.0–34.0)
MCHC: 29.8 g/dL — ABNORMAL LOW (ref 30.0–36.0)
MCV: 79.4 fL — ABNORMAL LOW (ref 80.0–100.0)
Platelets: 189 10*3/uL (ref 150–400)
RBC: 3.89 MIL/uL (ref 3.87–5.11)
RDW: 15.3 % (ref 11.5–15.5)
WBC: 10.2 10*3/uL (ref 4.0–10.5)
nRBC: 0 % (ref 0.0–0.2)

## 2021-01-16 MED ORDER — OXYTOCIN BOLUS FROM INFUSION
333.0000 mL | Freq: Once | INTRAVENOUS | Status: AC
  Administered 2021-01-17: 333 mL via INTRAVENOUS

## 2021-01-16 MED ORDER — LIDOCAINE HCL (PF) 1 % IJ SOLN: 30.0000 mL | INTRAMUSCULAR | Status: DC | PRN

## 2021-01-16 MED ORDER — SOD CITRATE-CITRIC ACID 500-334 MG/5ML PO SOLN: 30.0000 mL | ORAL | Status: DC | PRN

## 2021-01-16 MED ORDER — OXYCODONE-ACETAMINOPHEN 5-325 MG PO TABS: 1.0000 | ORAL_TABLET | ORAL | Status: DC | PRN

## 2021-01-16 MED ORDER — ONDANSETRON HCL 4 MG/2ML IJ SOLN
4.0000 mg | Freq: Four times a day (QID) | INTRAMUSCULAR | Status: DC | PRN
  Administered 2021-01-17: 4 mg via INTRAVENOUS
  Filled 2021-01-16: qty 2

## 2021-01-16 MED ORDER — LACTATED RINGERS IV SOLN: INTRAVENOUS | Status: DC

## 2021-01-16 MED ORDER — ACETAMINOPHEN 325 MG PO TABS: 650.0000 mg | ORAL_TABLET | ORAL | Status: DC | PRN

## 2021-01-16 MED ORDER — OXYCODONE-ACETAMINOPHEN 5-325 MG PO TABS: 2.0000 | ORAL_TABLET | ORAL | Status: DC | PRN

## 2021-01-16 MED ORDER — LACTATED RINGERS IV SOLN: 500.0000 mL | INTRAVENOUS | Status: DC | PRN

## 2021-01-16 MED ORDER — OXYTOCIN-SODIUM CHLORIDE 30-0.9 UT/500ML-% IV SOLN
2.5000 [IU]/h | INTRAVENOUS | Status: DC
  Filled 2021-01-16: qty 500

## 2021-01-16 NOTE — MAU Note (Signed)
Pt reports she was here for ctx's this morning.   Pt reports the ctx's picked up during the day.   Denies vaginal bleeding Denies LOF  Pt reports decreased fetal movement today.

## 2021-01-16 NOTE — MAU Note (Signed)
..  Carla Jones is a 20 y.o. at [redacted]w[redacted]d here in MAU reporting: contractions that are less than 5 minutes. Unsure if she is leaking fluid or not. +FM. Denies vaginal bleeding Last SVE: 2cm on Monday   Pain score: 6/10 Vitals:   01/16/21 0255  BP: 119/84  Pulse: (!) 121  Resp: 20  Temp: 97.9 F (36.6 C)  SpO2: 100%     FHT:150

## 2021-01-16 NOTE — H&P (Addendum)
OBSTETRIC ADMISSION HISTORY AND PHYSICAL  Carla Jones is a 20 y.o. female G1P0 with IUP at [redacted]w[redacted]d by LMP presenting for IOL for decreased fetal movement. She reports +FMs, no LOF, no VB, no blurry vision, headaches, peripheral edema, or RUQ pain.  She plans on breast feeding. She is unsure what she would like to pursue for birth control postpartum. She received her prenatal care at  [facility]    Dating: By LMP --->  Estimated Date of Delivery: 01/16/21  Sono:  No [place] in available records per [facility] . SGA per chart review. Will request records in the morning. Normal anatomy per patient.  Prenatal History/Complications:  SGA per chart review  Past Medical History: Past Medical History:  Diagnosis Date   Medical history non-contributory     Past Surgical History: Past Surgical History:  Procedure Laterality Date   WISDOM TOOTH EXTRACTION      Obstetrical History: OB History     Gravida  1   Para      Term      Preterm      AB      Living         SAB      IAB      Ectopic      Multiple      Live Births              Social History Social History   Socioeconomic History   Marital status: Single    Spouse name: Not on file   Number of children: Not on file   Years of education: Not on file   Highest education level: Not on file  Occupational History   Not on file  Tobacco Use   Smoking status: Never   Smokeless tobacco: Never  Vaping Use   Vaping Use: Never used  Substance and Sexual Activity   Alcohol use: Never   Drug use: Never   Sexual activity: Yes  Other Topics Concern   Not on file  Social History Narrative   Not on file   Social Determinants of Health   Financial Resource Strain: Not on file  Food Insecurity: Not on file  Transportation Needs: Not on file  Physical Activity: Not on file  Stress: Not on file  Social Connections: Not on file    Family History: History reviewed. No pertinent family  history.  Allergies: No Known Allergies  Medications Prior to Admission  Medication Sig Dispense Refill Last Dose   Ascorbic Acid (VITAMIN C PO) Take 1 tablet by mouth daily.      Cholecalciferol (D3 PO) Take 1 capsule by mouth daily.      famotidine (PEPCID) 10 MG tablet Take 1 tablet (10 mg total) by mouth 2 (two) times daily. 60 tablet 0    metoCLOPramide (REGLAN) 10 MG tablet Take 1 tablet (10 mg total) by mouth every 6 (six) hours as needed for nausea (nausea/headache). (Patient not taking: Reported on 11/04/2020) 10 tablet 0    omeprazole (PRILOSEC OTC) 20 MG tablet Take 1 tablet (20 mg total) by mouth daily. 30 tablet 0    Prenatal Vit-Fe Fumarate-FA (PRENATAL PO) Take 1 tablet by mouth daily.       Review of Systems  All systems reviewed and negative except as stated in HPI  Blood pressure (!) 131/95, pulse (!) 130, temperature 99.3 F (37.4 C), temperature source Oral, resp. rate 16, last menstrual period 10/26/2019, SpO2 100 %.  General appearance: alert  and cooperative Lungs: normal work of breathing Heart: regular rate and rhythm Extremities: no sign of DVT Fetal monitoring: Baseline: 140 bpm, Variability: Good {> 6 bpm), Accelerations: Reactive, and Decelerations: Absent Uterine activity: Frequency: Every 3 minutes Dilation: 1 Effacement (%): 70 Station: -2 Presentation: Vertex Exam by:: Zenia Resides, RN [phone]   Prenatal labs: ABO, Rh: --/--/A POS (10/24 2059) Antibody: NEG (10/24 2059) Rubella:  unknown RPR:   non-reactive HBsAg:   unknown HIV:   unknown GBS:   negative GTT: 1 hour wnl Genetic screening: none Anatomy US: normal per patient  Prenatal Transfer Tool  Maternal Diabetes: No Genetic Screening: Declined Maternal Ultrasounds/Referrals: Normal per patient Fetal Ultrasounds or other Referrals:  None Maternal Substance Abuse:  No Significant Maternal Medications:  None Significant Maternal Lab Results: Group B Strep negative  Results for  orders placed or performed during the hospital encounter of 01/16/21 (from the past 24 hour(s))  Type and screen [facility]   Collection Time: 01/16/21  8:59 PM  Result Value Ref Range   ABO/RH(D) A POS    Antibody Screen NEG    Sample Expiration      01/19/2021,2359 Performed at [facility], [address] [place], [place] [address]   CBC   Collection Time: 01/16/21  9:05 PM  Result Value Ref Range   WBC 10.2 4.0 - 10.5 K/uL   RBC 3.89 3.87 - 5.11 MIL/uL   Hemoglobin 9.2 (L) 12.0 - 15.0 g/dL   HCT 10.2 (L) 72.5 - 36.6 %   MCV 79.4 (L) 80.0 - 100.0 fL   MCH 23.7 (L) 26.0 - 34.0 pg   MCHC 29.8 (L) 30.0 - 36.0 g/dL   RDW 44.0 34.7 - 42.5 %   Platelets 189 150 - 400 K/uL   nRBC 0.0 0.0 - 0.2 %    Patient Active Problem List   Diagnosis Date Noted   Indication for care in labor and delivery, antepartum 01/16/2021   Anxiety and depression 01/16/2021    Assessment/Plan:  Carla Jones is a 20 y.o. G1P0 at [redacted]w[redacted]d here for IOL for decreased fetal movement.   #Labor: Contracting regularly. Will expectantly manage for now and reassess in 2-3 hours. Will consider cytotec or foley balloon pending next check. Methods of labor augmentation discussed with patient.  #Pain: None currently. Prn meds. Wants epidural later in labor process. #Fetal Well Being: Category I #ID:  Group B Strep negative #Method Of Feeding: breast feeding #Method Of Contraception: Unsure #Circ: N/A  Shelby Mattocks, DO 01/16/2021, 10:46 PM PGY-1, [facility]  GME ATTESTATION:  I saw and evaluated the patient. I agree with the findings and the plan of care as documented in the resident's note. I have made changes to documentation as necessary.  Evalina Field, MD OB Fellow, Faculty [facility] 01/17/2021 6:56 AM

## 2021-01-16 NOTE — MAU Provider Note (Signed)
S: Ms. Carla Jones is a 20 y.o. G1P0 at [redacted]w[redacted]d who presents to MAU today for labor evaluation.     Cervical exam by RN:  Dilation: 2 Effacement (%): 60 Station: -3 Exam by:: San Jetty RN  Fetal Monitoring: Baseline: 135 bpm Variability: moderate Accelerations: 15x15 present Decelerations: absent Contractions: q2-4  MDM Discussed patient with RN. Cervix unchanged after 1 hours. NST reviewed.   A: SIUP at [redacted]w[redacted]d  False labor  P: Discharge home Labor precautions and kick counts included in AVS Patient to follow-up with Shelah Lewandowsky L&D as scheduled for IOL today Patient may return to MAU as needed or when in labor      Camelia Eng, CNM 01/16/2021 4:42 AM

## 2021-01-17 ENCOUNTER — Inpatient Hospital Stay (HOSPITAL_COMMUNITY): Payer: Medicaid Other | Admitting: Anesthesiology

## 2021-01-17 DIAGNOSIS — Z3A40 40 weeks gestation of pregnancy: Secondary | ICD-10-CM

## 2021-01-17 DIAGNOSIS — O368131 Decreased fetal movements, third trimester, fetus 1: Secondary | ICD-10-CM

## 2021-01-17 DIAGNOSIS — Z8759 Personal history of other complications of pregnancy, childbirth and the puerperium: Secondary | ICD-10-CM | POA: Diagnosis not present

## 2021-01-17 LAB — HEPATITIS B SURFACE ANTIGEN: Hepatitis B Surface Ag: NONREACTIVE

## 2021-01-17 LAB — RAPID HIV SCREEN (HIV 1/2 AB+AG)
HIV 1/2 Antibodies: NONREACTIVE
HIV-1 P24 Antigen - HIV24: NONREACTIVE

## 2021-01-17 LAB — RPR: RPR Ser Ql: NONREACTIVE

## 2021-01-17 LAB — OB RESULTS CONSOLE HIV ANTIBODY (ROUTINE TESTING): HIV: NONREACTIVE

## 2021-01-17 MED ORDER — MISOPROSTOL 50MCG HALF TABLET
50.0000 ug | ORAL_TABLET | ORAL | Status: DC | PRN
  Administered 2021-01-17: 50 ug via BUCCAL
  Filled 2021-01-17: qty 1

## 2021-01-17 MED ORDER — FENTANYL-BUPIVACAINE-NACL 0.5-0.125-0.9 MG/250ML-% EP SOLN
12.0000 mL/h | EPIDURAL | Status: DC | PRN
  Administered 2021-01-17 (×2): 12 mL/h via EPIDURAL
  Filled 2021-01-17 (×2): qty 250

## 2021-01-17 MED ORDER — LACTATED RINGERS IV SOLN: 500.0000 mL | Freq: Once | INTRAVENOUS | Status: DC

## 2021-01-17 MED ORDER — DIPHENHYDRAMINE HCL 50 MG/ML IJ SOLN: 12.5000 mg | INTRAMUSCULAR | Status: DC | PRN

## 2021-01-17 MED ORDER — PHENYLEPHRINE 40 MCG/ML (10ML) SYRINGE FOR IV PUSH (FOR BLOOD PRESSURE SUPPORT): 80.0000 ug | PREFILLED_SYRINGE | INTRAVENOUS | Status: DC | PRN

## 2021-01-17 MED ORDER — BUPIVACAINE HCL (PF) 0.25 % IJ SOLN
INTRAMUSCULAR | Status: DC | PRN
  Administered 2021-01-17: 8 mL via EPIDURAL

## 2021-01-17 MED ORDER — FENTANYL CITRATE (PF) 100 MCG/2ML IJ SOLN
50.0000 ug | INTRAMUSCULAR | Status: DC | PRN
  Administered 2021-01-17 (×2): 100 ug via INTRAVENOUS
  Filled 2021-01-17 (×3): qty 2

## 2021-01-17 MED ORDER — FENTANYL-BUPIVACAINE-NACL 0.5-0.125-0.9 MG/250ML-% EP SOLN: 12.0000 mL/h | EPIDURAL | Status: DC | PRN

## 2021-01-17 MED ORDER — FENTANYL CITRATE (PF) 100 MCG/2ML IJ SOLN
INTRAMUSCULAR | Status: DC | PRN
  Administered 2021-01-17: 100 ug via EPIDURAL

## 2021-01-17 MED ORDER — EPHEDRINE 5 MG/ML INJ: 10.0000 mg | INTRAVENOUS | Status: DC | PRN

## 2021-01-17 MED ORDER — TERBUTALINE SULFATE 1 MG/ML IJ SOLN: 0.2500 mg | Freq: Once | INTRAMUSCULAR | Status: DC | PRN

## 2021-01-17 MED ORDER — DIPHENHYDRAMINE HCL 50 MG/ML IJ SOLN
12.5000 mg | INTRAMUSCULAR | Status: DC | PRN
Start: 2021-01-17 — End: 2021-01-18

## 2021-01-17 MED ORDER — FENTANYL CITRATE (PF) 100 MCG/2ML IJ SOLN
INTRAMUSCULAR | Status: AC
  Administered 2021-01-17: 50 ug via INTRAVENOUS
  Filled 2021-01-17: qty 2

## 2021-01-17 MED ORDER — LIDOCAINE HCL (PF) 1 % IJ SOLN
INTRAMUSCULAR | Status: DC | PRN
  Administered 2021-01-17: 10 mL via EPIDURAL

## 2021-01-17 MED ORDER — OXYTOCIN-SODIUM CHLORIDE 30-0.9 UT/500ML-% IV SOLN: 1.0000 m[IU]/min | INTRAVENOUS | Status: DC

## 2021-01-17 NOTE — Anesthesia Preprocedure Evaluation (Signed)
Anesthesia Evaluation  Patient identified by MRN, date of birth, ID band Patient awake    Reviewed: Allergy & Precautions, H&P , NPO status , Patient's Chart, lab work & pertinent test results, reviewed documented beta blocker date and time   Airway Mallampati: II  TM Distance: >3 FB Neck ROM: full    Dental no notable dental hx.    Pulmonary neg pulmonary ROS,    Pulmonary exam normal breath sounds clear to auscultation       Cardiovascular negative cardio ROS Normal cardiovascular exam Rhythm:regular Rate:Normal     Neuro/Psych negative neurological ROS  negative psych ROS   GI/Hepatic negative GI ROS, Neg liver ROS,   Endo/Other  negative endocrine ROS  Renal/GU negative Renal ROS  negative genitourinary   Musculoskeletal   Abdominal   Peds  Hematology negative hematology ROS (+)   Anesthesia Other Findings   Reproductive/Obstetrics (+) Pregnancy                             Anesthesia Physical Anesthesia Plan  ASA: 2  Anesthesia Plan: Epidural   Post-op Pain Management:    Induction:   PONV Risk Score and Plan:   Airway Management Planned: Natural Airway  Additional Equipment: None  Intra-op Plan:   Post-operative Plan:   Informed Consent: I have reviewed the patients History and Physical, chart, labs and discussed the procedure including the risks, benefits and alternatives for the proposed anesthesia with the patient or authorized representative who has indicated his/her understanding and acceptance.       Plan Discussed with: Anesthesiologist  Anesthesia Plan Comments:         Anesthesia Quick Evaluation

## 2021-01-17 NOTE — Discharge Summary (Signed)
Postpartum Discharge Summary     Patient Name: Carla Jones DOB: 23-Mar-2001 MRN: [id number]  Date of admission: 01/16/2021 Delivery date:01/17/2021  Delivering provider: Wells Guiles  Date of discharge: 01/19/2021  Admitting diagnosis: Indication for care in labor and delivery, antepartum [O75.9] Intrauterine pregnancy: [redacted]w[redacted]d    Secondary diagnosis:  Principal Problem:   Vaginal delivery Active Problems:   Indication for care in labor and delivery, antepartum   Anxiety and depression   Shoulder dystocia, delivered  Additional problems: Acute blood loss anemia s/p IV Venofer   Discharge diagnosis: Term Pregnancy Delivered                                              Post partum procedures: None Augmentation: Cytotec and IP Foley Complications: None  Hospital course: Induction of Labor With Vaginal Delivery   20y.o. yo G1P0 at 465w1das admitted to the hospital 01/16/2021 for induction of labor.  Indication for induction:  Decreased fetal movement .  Patient had an uncomplicated labor course as follows: Membrane Rupture Time/Date: 12:55 PM ,01/17/2021   Delivery Method:Vaginal, Spontaneous  Episiotomy: None  Lacerations:  None  Details of delivery can be found in separate delivery note.  Patient had a routine postpartum course and is meeting all milestones. She received IV Venofer for her anemia postpartum and remained asymptomatic while inpatient. She will continue PO iron outpatient. Patient is discharged home 01/19/21.  Newborn Data: Birth date:01/17/2021  Birth time:11:00 PM  Gender:Female  Living status:Living  Apgars:5 ,8  Weight:3742 g   Magnesium Sulfate received: No BMZ received: No Rhophylac: N/A MMR: N/A - Immune  T-DaP: Offered postpartum Flu: No Transfusion: No  Physical exam  Vitals:   01/18/21 1500 01/18/21 2205 01/18/21 2316 01/19/21 0510  BP: 123/81 (!) 126/93 134/69 112/72  Pulse: 94 (!) 104 99 95  Resp:  17  19  Temp: 98.5  F (36.9 C) 98.5 F (36.9 C)  97.9 F (36.6 C)  TempSrc: Axillary Oral  Oral  SpO2:  100%  98%   General: alert, cooperative, and no distress Lochia: appropriate Uterine Fundus: firm and below umbilicus DVT Evaluation: no LE edema or calf tenderness to palpation   Labs: Lab Results  Component Value Date   WBC 20.1 (H) 01/18/2021   HGB 8.0 (L) 01/18/2021   HCT 26.4 (L) 01/18/2021   MCV 78.1 (L) 01/18/2021   PLT 174 01/18/2021   CMP Latest Ref Rng & Units 11/04/2020  Glucose 70 - 99 mg/dL 113(H)  BUN 6 - 20 mg/dL <5(L)  Creatinine 0.44 - 1.00 mg/dL 0.57  Sodium 135 - 145 mmol/L 137  Potassium 3.5 - 5.1 mmol/L 3.6  Chloride 98 - 111 mmol/L 107  CO2 22 - 32 mmol/L 21(L)  Calcium 8.9 - 10.3 mg/dL 8.9  Total Protein 6.5 - 8.1 g/dL 6.3(L)  Total Bilirubin 0.3 - 1.2 mg/dL <0.1(L)  Alkaline Phos 38 - 126 U/L 50  AST 15 - 41 U/L 28  ALT 0 - 44 U/L 24   Edinburgh Score: No flowsheet data found.   After visit meds:  Allergies as of 01/19/2021   No Known Allergies      Medication List     STOP taking these medications    famotidine 10 MG tablet Commonly known as: PEPCID   metoCLOPramide 10 MG tablet Commonly known as:  Reglan   omeprazole 20 MG tablet Commonly known as: PriLOSEC OTC       TAKE these medications    acetaminophen 500 MG tablet Commonly known as: TYLENOL Take 2 tablets (1,000 mg total) by mouth every 8 (eight) hours as needed (pain).   D3 PO Take 1 capsule by mouth daily.   ferrous sulfate 325 (65 FE) MG EC tablet Take 1 tablet (325 mg total) by mouth every other day.   ibuprofen 600 MG tablet Commonly known as: ADVIL Take 1 tablet (600 mg total) by mouth every 6 (six) hours as needed (pain).   PRENATAL PO Take 1 tablet by mouth daily.   VITAMIN C PO Take 1 tablet by mouth daily.         Discharge home in stable condition Infant Feeding: Bottle and Breast Infant Disposition: home with mother Discharge instruction: per After  Visit Summary and Postpartum booklet. Activity: Advance as tolerated. Pelvic rest for 6 weeks.  Diet: routine diet Future Appointments: Future Appointments  Date Time Provider [place]  02/15/2021  1:15 PM Genia Del, MD [facility]   Follow up Visit: Followed with [facility] prenatally. Would like to follow with [facility] postpartum. Message sent to [facility] by Dr. Gwenlyn Perking on 01/17/21.   Please schedule this patient for a In person postpartum visit in 4 weeks with the following provider: Any provider. Additional Postpartum F/U: None   Low risk pregnancy complicated by:  None Delivery mode:  Vaginal, Spontaneous  Anticipated Birth Control:  Unsure; planning to decide at postpartum visit  01/19/2021 Genia Del, MD

## 2021-01-17 NOTE — Anesthesia Procedure Notes (Signed)
Epidural Patient location during procedure: OB Start time: 01/17/2021 4:30 AM End time: 01/17/2021 4:35 AM  Staffing Anesthesiologist: Bethena Midget, MD  Preanesthetic Checklist Completed: patient identified, IV checked, site marked, risks and benefits discussed, surgical consent, monitors and equipment checked, pre-op evaluation and timeout performed  Epidural Patient position: sitting Prep: DuraPrep and site prepped and draped Patient monitoring: continuous pulse ox and blood pressure Approach: midline Location: L3-L4 Injection technique: LOR air  Needle:  Needle type: Tuohy  Needle gauge: 17 G Needle length: 9 cm and 9 Needle insertion depth: 5 cm cm Catheter type: closed end flexible Catheter size: 19 Gauge Catheter at skin depth: 10 cm Test dose: negative  Assessment Events: blood not aspirated, injection not painful, no injection resistance, no paresthesia and negative IV test

## 2021-01-17 NOTE — Progress Notes (Signed)
Labor Progress Note Carla Jones is a 20 y.o. G1P0 at [redacted]w[redacted]d presented for IOL 2/2 DFM  S: No concerns.   O:  BP 107/66   Pulse (!) 123   Temp 99.5 F (37.5 C) (Axillary)   Resp 16   LMP 10/26/2019   SpO2 98%  EFM: 150 bpm/15x15/no decels, 1-2 mins  CVE: Dilation: 9 Effacement (%): 90 Station: 0 Presentation: Vertex Exam by:: Earlene Plater, RN   A&P: 20 y.o. G1P0 [redacted]w[redacted]d IOL 2/2 DFM #Labor: Cervical exam unchanged from last. IUPC attempted unsuccessful. Cervix paper thin located anteriorly and maternal left. Consider laboring down with position changes. Consider starting pitocin.  #Pain: Epidural #FWB: Cat I #GBS negative  Jerric Oyen Autry-Lott, DO 7:38 PM

## 2021-01-17 NOTE — Progress Notes (Signed)
Labor Progress Note Carla Jones is a 20 y.o. G1P0 at [redacted]w[redacted]d presented for IOL 2/2 DFM.   S: Doing well. No concern. Father of baby and grandfather at bedside.   O:  BP 116/64   Pulse 91   Temp 98.3 F (36.8 C) (Oral)   Resp 16   LMP 10/26/2019   SpO2 98%  EFM: 130 bpm/10x10/no decels, every 1-2 mins  CVE: Dilation: 4 Effacement (%): 90 Station: -2, -1 Presentation: Vertex Exam by:: Earlene Plater, RN   A&P: 20 y.o. G1P0 [redacted]w[redacted]d IOL 2/2 DFM @term .  #Labor: Progressing well. S/p FB. Contracting every 1-2 mins. Will hold off on pit for now and reassessed need at next cervical exam around  4 hours from now.  #Pain: Epidural #FWB: Cat I #GBS negative  Deanna Boehlke Autry-Lott, DO 11:21 AM

## 2021-01-17 NOTE — Progress Notes (Signed)
Labor Progress Note Carla Jones is a 20 y.o. G1P0 at [redacted]w[redacted]d presented for IOL due to DFM at term.  S: Doing well. Comfortable after epidural. No concerns at this time.  O:  BP 123/62   Pulse (!) 108   Temp 98.2 F (36.8 C) (Oral)   Resp 16   LMP 10/26/2019   SpO2 98%   EFM: Baseline 140 bpm, moderate variability, + accels, no decels   CVE: Dilation: 1.5 Effacement (%): 70 Station: -1 Presentation: Vertex Exam by:: Dr. Mathis Fare   A&P: 20 y.o. G1P0 [redacted]w[redacted]d   #Labor: Minimal change since last check. Foley balloon placed with 60 cc. Will also give Cytotec 50 mcg buccally. Plan to reassess in 4 hours.  #Pain: Epidural  #FWB: Cat 1  #GBS negative  Worthy Rancher, MD 5:34 AM

## 2021-01-17 NOTE — Progress Notes (Signed)
Labor Progress Note Carla Jones is a 20 y.o. G1P0 at [redacted]w[redacted]d who presented for IOL due to DFM.  S: Feeling more pain on her left side. Has bolused her epidural a few times and feels the pain is the same. No other concerns at this time. FOB at bedside.   O:  BP 123/80   Pulse (!) 109   Temp 99 F (37.2 C) (Oral)   Resp 17   LMP 10/26/2019   SpO2 98%   EFM: Baseline 135 bpm, moderate variability, + accels, no decels   CVE: Dilation: 10 Dilation Complete Date: 01/17/21 Dilation Complete Time: 2150 Effacement (%): 100 Station: Plus 1 Presentation: Vertex Exam by:: Dr. Mathis Fare   A&P: 20 y.o. G1P0 [redacted]w[redacted]d   #Labor: Patient is now complete and plus 1 station. Feeling more pain/pressure. Will start pushing.  #Pain: Epidural  #FWB: Cat 1  #GBS negative  #Anticipated MOD: SVD  Worthy Rancher, MD 9:58 PM

## 2021-01-17 NOTE — Progress Notes (Signed)
Labor Progress Note Carla Jones is a 20 y.o. G1P0 at [redacted]w[redacted]d presented for  IOL 2/2 DFM.  S: Doing well. Was having right pelvic discomfort that has since resolved.   O:  BP (!) 111/50   Pulse 94   Temp 98.5 F (36.9 C) (Oral)   Resp 16   LMP 10/26/2019   SpO2 98%  EFM: 135 bpm/15x15/ no decels, ctx every 2 mins  CVE: Dilation: 9 Effacement (%): 90 Station: -1 Presentation: Vertex Exam by:: Dr. Salvadore Dom   A&P: 20 y.o. G1P0 [redacted]w[redacted]d IOL 2/2 DFM #Labor: Progressing well. S/p cytotec, FB.  #Pain: Epidural  #FWB: Cat I #GBS negative  Carla Rishel Autry-Lott, DO 4:37 PM

## 2021-01-18 ENCOUNTER — Encounter (HOSPITAL_COMMUNITY): Payer: Self-pay | Admitting: Obstetrics and Gynecology

## 2021-01-18 LAB — RUBELLA SCREEN: Rubella: 2.61 index (ref >0.99)

## 2021-01-18 LAB — CBC
HCT: 26.4 % — ABNORMAL LOW (ref 36.0–46.0)
Hemoglobin: 8 g/dL — ABNORMAL LOW (ref 12.0–15.0)
MCH: 23.7 pg — ABNORMAL LOW (ref 26.0–34.0)
MCHC: 30.3 g/dL (ref 30.0–36.0)
MCV: 78.1 fL — ABNORMAL LOW (ref 80.0–100.0)
Platelets: 174 10*3/uL (ref 150–400)
RBC: 3.38 MIL/uL — ABNORMAL LOW (ref 3.87–5.11)
RDW: 15.7 % — ABNORMAL HIGH (ref 11.5–15.5)
WBC: 20.1 10*3/uL — ABNORMAL HIGH (ref 4.0–10.5)
nRBC: 0 % (ref 0.0–0.2)

## 2021-01-18 MED ORDER — TETANUS-DIPHTH-ACELL PERTUSSIS 5-2.5-18.5 LF-MCG/0.5 IM SUSY: 0.5000 mL | PREFILLED_SYRINGE | Freq: Once | INTRAMUSCULAR | Status: DC

## 2021-01-18 MED ORDER — ONDANSETRON HCL 4 MG PO TABS: 4.0000 mg | ORAL_TABLET | ORAL | Status: DC | PRN

## 2021-01-18 MED ORDER — ACETAMINOPHEN 325 MG PO TABS: 650.0000 mg | ORAL_TABLET | ORAL | Status: DC | PRN

## 2021-01-18 MED ORDER — SENNOSIDES-DOCUSATE SODIUM 8.6-50 MG PO TABS
2.0000 | ORAL_TABLET | Freq: Every day | ORAL | Status: DC
  Administered 2021-01-18 – 2021-01-19 (×2): 2 via ORAL
  Filled 2021-01-18 (×2): qty 2

## 2021-01-18 MED ORDER — COCONUT OIL OIL: 1.0000 "application " | TOPICAL_OIL | Status: DC | PRN

## 2021-01-18 MED ORDER — ONDANSETRON HCL 4 MG/2ML IJ SOLN: 4.0000 mg | INTRAMUSCULAR | Status: DC | PRN

## 2021-01-18 MED ORDER — WITCH HAZEL-GLYCERIN EX PADS: 1.0000 "application " | MEDICATED_PAD | CUTANEOUS | Status: DC | PRN

## 2021-01-18 MED ORDER — BENZOCAINE-MENTHOL 20-0.5 % EX AERO: 1.0000 "application " | INHALATION_SPRAY | CUTANEOUS | Status: DC | PRN

## 2021-01-18 MED ORDER — PRENATAL MULTIVITAMIN CH
1.0000 | ORAL_TABLET | Freq: Every day | ORAL | Status: DC
  Administered 2021-01-18 – 2021-01-19 (×2): 1 via ORAL
  Filled 2021-01-18 (×2): qty 1

## 2021-01-18 MED ORDER — DIBUCAINE (PERIANAL) 1 % EX OINT: 1.0000 "application " | TOPICAL_OINTMENT | CUTANEOUS | Status: DC | PRN

## 2021-01-18 MED ORDER — DIPHENHYDRAMINE HCL 25 MG PO CAPS: 25.0000 mg | ORAL_CAPSULE | Freq: Four times a day (QID) | ORAL | Status: DC | PRN

## 2021-01-18 MED ORDER — SODIUM CHLORIDE 0.9 % IV SOLN
500.0000 mg | Freq: Once | INTRAVENOUS | Status: AC
  Administered 2021-01-18: 500 mg via INTRAVENOUS
  Filled 2021-01-18: qty 25

## 2021-01-18 MED ORDER — IBUPROFEN 600 MG PO TABS
600.0000 mg | ORAL_TABLET | Freq: Four times a day (QID) | ORAL | Status: DC
  Administered 2021-01-18 – 2021-01-19 (×6): 600 mg via ORAL
  Filled 2021-01-18 (×6): qty 1

## 2021-01-18 MED ORDER — SIMETHICONE 80 MG PO CHEW: 80.0000 mg | CHEWABLE_TABLET | ORAL | Status: DC | PRN

## 2021-01-18 NOTE — Lactation Note (Signed)
This note was copied from a baby's chart. Lactation Consultation Note  Patient Name: Carla Jones Date: 01/18/2021 Reason for consult: Follow-up assessment;Mother's request;Difficult latch;Primapara;1st time breastfeeding;Term;Nipple pain/trauma Age:20 hours  RN called to get assistance with latching. Infant not able to sustain latch with short shafted small nipples.   LC tried prone position infant bobbing on and off. LC used 20 NS while supporting latch burp heard and audible swallows. Mom left supporting latch, become shallow some bleeding noted.   Mom take breast rest on left. Mom will need assistance with latching for next feeding with NS given small nipples.  Plan 1. To feed based on cues 8-12x 24hr period.  2. Mom to offer breast with help of NS if unable to latch in prone position.  3. Mom to offer eBM from hand expression or pumping with spoon or if enough volume with pace bottle feeding and slow flow nipple.   All questions answered at the end of the visit.  Mom using EBM and coconut oil for wound healing.    Maternal Data Has patient been taught Hand Expression?: Yes  Feeding Mother's Current Feeding Choice: Breast Milk  LATCH Score Latch: Repeated attempts needed to sustain latch, nipple held in mouth throughout feeding, stimulation needed to elicit sucking reflex.  Audible Swallowing: Spontaneous and intermittent  Type of Nipple: Flat  Comfort (Breast/Nipple): Filling, red/small blisters or bruises, mild/mod discomfort  Hold (Positioning): Assistance needed to correctly position infant at breast and maintain latch.  LATCH Score: 6   Lactation Tools Discussed/Used Tools: Pump;Flanges;Coconut oil;Nipple Shields Nipple shield size: 20 (Mom short shafted small nipples. Infant not able to sustain the latch even in prone postion. LC used 20 NS infant good transfer audible swallows. End of feeding, mother lost deep latch some trauma and bleeding. Mom  taking breast rest on left.) Flange Size: 21 Breast pump type: Double-Electric Breast Pump Pump Education: Setup, frequency, and cleaning;Milk Storage Reason for Pumping: increase stimulation Pumping frequency: every 3 hrs for  Interventions Interventions: Breast feeding basics reviewed;Support pillows;Education;Assisted with latch;Position options;Pace feeding;Skin to skin;Expressed milk;Breast massage;Hand express;Breast compression;DEBP;Adjust position;Coconut oil;Visual merchandiser education  Discharge    Consult Status Consult Status: Follow-up Date: 01/19/21 Follow-up type: In-patient    Habiba Treloar  Nicholson-Springer 01/18/2021, 7:32 PM

## 2021-01-18 NOTE — Plan of Care (Signed)

## 2021-01-18 NOTE — Lactation Note (Signed)
This note was copied from a baby's chart. Lactation Consultation Note  Patient Name: Carla Jones [id number] Date: 01/18/2021   Age:20 hours  LC visit attempted; Mom sleeping. LC to f/u later.   Lurline Hare [facility] 01/18/2021, 7:47 AM

## 2021-01-18 NOTE — Anesthesia Postprocedure Evaluation (Signed)
Anesthesia Post Note  Patient: Carla Jones  Procedure(s) Performed: AN AD HOC LABOR EPIDURAL     Patient location during evaluation: Mother Baby Anesthesia Type: Epidural Level of consciousness: awake Pain management: satisfactory to patient Vital Signs Assessment: post-procedure vital signs reviewed and stable Respiratory status: spontaneous breathing Cardiovascular status: stable Anesthetic complications: no   No notable events documented.  Last Vitals:  Vitals:   01/18/21 0640 01/18/21 0846  BP: 122/71 (!) 120/53  Pulse: (!) 116 (!) 101  Resp: 18 17  Temp: 37 C 36.8 C  SpO2: 100% 99%    Last Pain:  Vitals:   01/18/21 0854  TempSrc:   PainSc: 0-No pain   Pain Goal:                   Cephus Shelling

## 2021-01-18 NOTE — Lactation Note (Signed)
This note was copied from a baby's chart. Lactation Consultation Note  Patient Name: Carla Jones [id number] Date: 01/18/2021 Reason for consult: Initial assessment;Mother's request;Difficult latch;Primapara;1st time breastfeeding;Term;Breastfeeding assistance;Other (Comment) (Anemia ( iron infusion)) Age:20 hours LC worked with infant on latching. Mom short shafted small nipples. Not able to sustain latch at this time. Mom to offer EBM via spoon for now.   Mom given manual pump instructions to pre pump 5-10 min before latching.   Plan 1. To feed based on cues 8-12x 24 hr period. Mom to offer breasts and look for signs of milk transfer.  2. If unable to latch, Mom to offer EBM via spoon.  3. I and O sheet reviewed.  All questions answered at the end of the visit.  Mom to call for latch assistance with next feeding.   Maternal Data Has patient been taught Hand Expression?: Yes Does the patient have breastfeeding experience prior to this delivery?: No  Feeding Mother's Current Feeding Choice: Breast Milk  LATCH Score                    Lactation Tools Discussed/Used Tools: Pump;Flanges Flange Size: 21 Breast pump type: Manual Pump Education: Setup, frequency, and cleaning;Milk Storage Reason for Pumping: elongate her nipples Pumping frequency: pre pump 5-10 min before latching  Interventions Interventions: Breast feeding basics reviewed;Support pillows;Education;Assisted with latch;Position options;Skin to skin;Expressed milk;Breast massage;Hand express;Infant Driven Feeding Algorithm education;LC Services brochure;Breast compression;Adjust position;Hand pump  Discharge Pump: Manual WIC Program: No (mom has a appt with WIC in november)  Consult Status Consult Status: Follow-up Date: 01/19/21 Follow-up type: In-patient    Carla Sizemore  Jones 01/18/2021, 12:33 PM

## 2021-01-18 NOTE — Clinical Social Work Maternal (Signed)
CLINICAL SOCIAL WORK MATERNAL/CHILD NOTE  Patient Details  Name: Carla Jones MRN: [id number] Date of Birth: 2001-01-26  Date:  01/18/2021  Clinical Social Worker Initiating Note:  Carla Jones, [place] Date/Time: Initiated:  01/18/21/2345     Child's Name:  Carla Jones   Biological Parents:  Mother, Father (MOB: Carla Jones ([birth year]) FOB: Carla Jones (03-31-2001))   Need for Interpreter:  None   Reason for Referral:  Current Substance Use/Substance Use During Pregnancy  , Behavioral Health Concerns   Address:  [place] [address]    Phone number:  [phone] (home)     Additional phone number:   Household Members/Support Persons (HM/SP):   Household Member/Support Person 1   HM/SP Name Relationship DOB or Age  HM/SP -1 Carla Jones Mother 02-19-1982  HM/SP -2        HM/SP -3        HM/SP -4        HM/SP -5        HM/SP -6        HM/SP -7        HM/SP -8          Natural Supports (not living in the home):  Spouse/significant other, Extended Family, Friends    Chiropodist: None   Employment: Part-time   Type of Work: Secondary school teacher   Education:  [employer] school graduate   Homebound arranged:    Museum/gallery curator Resources:  Medicaid   Other Resources:  Physicist, medical  , [place] Considerations Which May Impact Care:    Strengths:  Ability to meet basic needs  , Home prepared for child  , Compliance with medical plan     Psychotropic Medications:         Pediatrician:       Pediatrician List:   [place]      Pediatrician Fax Number:    Risk Factors/Current Problems:  Substance Use  , Mental Health Concerns     Cognitive State:  Able to Concentrate  , Insightful  , Alert  , Linear Thinking     Mood/Affect:  Calm  , Comfortable  , Happy  , Relaxed     CSW Assessment: CSW received consult  for hx of Anxiety, Depression and THC use.  CSW met with MOB to offer support and complete assessment.    CSW met with MOB at bedside and introduced role. CSW observed MOB sitting up in bed, FOB asleep on couch and support person present. CSW offered MOB privacy. FOB and support person left to allow MOB privacy. MOB presented pleasant calm and welcomed CSW visit. MOB confirmed the hospital has the correct demographic information on file. MOB reported she resides in the household with her mom, and sometimes FOB. MOB acknowledged FOB and her mom as supports. CSW inquired how MOB has felt since giving birth. MOB expressed feeling good, tired, in some pain but tolerable. MOB shared the L&D was life changing experience, in a good way. CSW inquired about MOB history of depression and anxiety. MOB disclosed she was diagnosed with depression and anxiety last summer after she had to move out her father's home. MOB reported she took Lexapro prior to pregnancy and was in therapy for 6-8 months and recently stopped on September 14. MOB reported she plans to resume therapy when she finds  a therapist, she is more comfortable with seeing. CSW inquired about MOB coping skill. MOB shared she converses with FOB and her mom when she is in a low mood and feeling anxious. CSW encouraged MOB to continue her efforts. MOB listened attentively as CSW provided education regarding the baby blues period vs. perinatal mood disorders, discussed treatment and gave resources for mental health follow up if concerns arise.  CSW recommended MOB complete the self-evaluation during the postpartum time period using the New Mom Checklist from Postpartum Progress and encouraged MOB to contact a medical professional if symptoms are noted at any time.  MOB reported she feels comfortable reaching out to her doctor if she had concerns. MOB denied SI/HI/DV.   CSW inquired about MOB substance use during pregnancy. MOB disclosed the last time she used THC  was in April. MOB reported she stopped using THC because she feared the unknown affects it might have on the infant's health. CSW informed MOB about the hospital drug screen policy. MOB made aware that CSW will monitor the infant UDS, CDS and make a report if warranted. MOB reported understanding and had questions.  MOB reported she has essential items for the infant including a crib where the infant will sleep. CSW provided review of Sudden Infant Death Syndrome (SIDS) precautions. MOB reported understanding. MOB reported her [facility] appointment is scheduled for November 1st. MOB reported she is still deciding on a pediatrician for infant' follow up care. CSW assessed MOB for additional community needs. MOB shared she has more than enough support at this time and declined community referrals.   CSW identifies no further need for intervention and no barriers to discharge at this time.    CSW Plan/Description:  Sudden Infant Death Syndrome (SIDS) Education, CSW Will Continue to Monitor Umbilical Cord Tissue Drug Screen Results and Make Report if Warranted, [place], Perinatal Mood and Anxiety Disorder (PMADs) Education, No Further Intervention Required/No Barriers to Discharge    Carla Jones, [place] 01/18/2021, 2:46 PM

## 2021-01-18 NOTE — Progress Notes (Signed)
POSTPARTUM PROGRESS NOTE  Subjective: Carla Jones is a 20 y.o. G1P1001 s/p  VD  at [redacted]w[redacted]d.  She reports she is doing well. No acute events overnight. She denies any problems with ambulating, voiding or po intake. Denies nausea or vomiting. She has passed flatus. She has not had bowel movement. Pain is well controlled.  Lochia is Minimal.   Objective: BP (!) 120/53 (BP Location: Right Arm)   Pulse (!) 101   Temp 98.2 F (36.8 C) (Oral)   Resp 17   LMP 10/26/2019   SpO2 99%   Breastfeeding Unknown   Physical Exam:  General: alert, cooperative and no distress Chest: no respiratory distress Abdomen: soft, non-tender  Uterine Fundus: firm, appropriately tender Extremities: No calf swelling, tenderness, or edema  Recent Labs    01/16/21 2105 01/18/21 0435  HGB 9.2* 8.0*  HCT 30.9* 26.4*    Assessment/Plan: Carla Jones is a 20 y.o. G1P1001 s/p VD at [redacted]w[redacted]d for DFM at term.   LOS: 2 days   PPD#1: Doing well, pain well-controlled.  -- Routine postpartum care, lactation support -- Encouraged up OOB -- Contraception:  plans to discuss with PCP postpartum   -- Feeding: breast feeding -- Circumcision: N/A -- Hgb 9.2 > 8.0. IV Venofer 500mg   Dispo: Plan for discharge tomorrow.  , DO 01/18/2021, 9:13 AM PGY-1, [facility]

## 2021-01-19 MED ORDER — IBUPROFEN 600 MG PO TABS: 600.0000 mg | ORAL_TABLET | Freq: Four times a day (QID) | ORAL | 0 refills | Status: AC | PRN

## 2021-01-19 MED ORDER — FERROUS SULFATE 325 (65 FE) MG PO TBEC: 325.0000 mg | DELAYED_RELEASE_TABLET | ORAL | 1 refills | Status: AC

## 2021-01-19 MED ORDER — ACETAMINOPHEN 500 MG PO TABS: 1000.0000 mg | ORAL_TABLET | Freq: Three times a day (TID) | ORAL | 0 refills | Status: AC | PRN

## 2021-01-19 NOTE — Lactation Note (Signed)
This note was copied from a baby's chart. Lactation Consultation Note  Patient Name: Carla Jones [id number] Date: 01/19/2021   Age:20 hours  Mom's L nipple has a small mark on surface from previous nipple shield use (per Mom's report). Mom has not been latching infant to the breast recently; she is content to pump & BO for the time being.   Mom's breasts are feeling fuller, but she had only expressed 3-4 mL when using the DEBP. Mom finds using the DEBP to be uncomfortable, so I taught her how to use the hand pump in case that was more comfortable (the size 21 flange is still appropriate). Mom did not find the hand pump to be more comfortable.  Hand expression was taught to Mom, but it caused discomfort. Despite the lack of comfort of the things mentioned above, Mom is determined to express her milk. I anticipate that it will become easier for her to express her milk as it continues to come to volume.    Parents' questions were answered to their satisfaction. Parents know how to reach [place] with any post-discharge questions    Remigio Eisenmenger 01/19/2021, 12:28 PM

## 2021-01-24 DIAGNOSIS — Z5181 Encounter for therapeutic drug level monitoring: Secondary | ICD-10-CM | POA: Diagnosis not present

## 2021-01-28 ENCOUNTER — Telehealth (HOSPITAL_COMMUNITY): Payer: Self-pay

## 2021-01-28 NOTE — Telephone Encounter (Addendum)
No answer. Mailbox is full.   Marcelino Duster [facility] 01/28/2021,1132

## 2021-02-03 DIAGNOSIS — F4310 Post-traumatic stress disorder, unspecified: Secondary | ICD-10-CM | POA: Diagnosis not present

## 2021-02-15 ENCOUNTER — Ambulatory Visit: Payer: Medicaid Other | Admitting: Family Medicine

## 2021-02-15 DIAGNOSIS — F4310 Post-traumatic stress disorder, unspecified: Secondary | ICD-10-CM | POA: Diagnosis not present

## 2021-02-23 DIAGNOSIS — Z419 Encounter for procedure for purposes other than remedying health state, unspecified: Secondary | ICD-10-CM | POA: Diagnosis not present

## 2021-03-15 ENCOUNTER — Ambulatory Visit: Payer: Medicaid Other | Admitting: Nurse Practitioner

## 2021-03-26 DIAGNOSIS — Z419 Encounter for procedure for purposes other than remedying health state, unspecified: Secondary | ICD-10-CM | POA: Diagnosis not present

## 2021-04-12 ENCOUNTER — Other Ambulatory Visit: Payer: Self-pay

## 2021-04-12 ENCOUNTER — Ambulatory Visit (INDEPENDENT_AMBULATORY_CARE_PROVIDER_SITE_OTHER): Payer: Medicaid Other | Admitting: Nurse Practitioner

## 2021-04-12 ENCOUNTER — Encounter: Payer: Self-pay | Admitting: Nurse Practitioner

## 2021-04-12 VITALS — BP 126/88 | HR 74 | Wt 141.3 lb

## 2021-04-12 DIAGNOSIS — Z975 Presence of (intrauterine) contraceptive device: Secondary | ICD-10-CM | POA: Insufficient documentation

## 2021-04-12 DIAGNOSIS — Z6823 Body mass index (BMI) 23.0-23.9, adult: Secondary | ICD-10-CM | POA: Diagnosis not present

## 2021-04-12 DIAGNOSIS — Z30017 Encounter for initial prescription of implantable subdermal contraceptive: Secondary | ICD-10-CM | POA: Diagnosis not present

## 2021-04-12 DIAGNOSIS — Z01419 Encounter for gynecological examination (general) (routine) without abnormal findings: Secondary | ICD-10-CM | POA: Diagnosis not present

## 2021-04-12 LAB — POCT PREGNANCY, URINE: Preg Test, Ur: NEGATIVE

## 2021-04-12 MED ORDER — ETONOGESTREL 68 MG ~~LOC~~ IMPL
68.0000 mg | DRUG_IMPLANT | Freq: Once | SUBCUTANEOUS | Status: AC
  Administered 2021-04-12: 68 mg via SUBCUTANEOUS

## 2021-04-12 NOTE — Progress Notes (Signed)
GYNECOLOGY ANNUAL PREVENTATIVE CARE ENCOUNTER NOTE  Subjective:   Carla Jones is a 21 y.o. G56P1001 female here for a routine annual gynecologic exam.  Current complaints: wants Nexplanon inserted.  Missed postpartum visit.   Denies abnormal vaginal bleeding, discharge, pelvic pain, problems with intercourse or other gynecologic concerns.    Gynecologic History Patient's last menstrual period was 04/02/2021 (exact date). Contraception: condoms Last Pap: NA.   Obstetric History OB History  Gravida Para Term Preterm AB Living  1 1 1     1   SAB IAB Ectopic Multiple Live Births        0 1    # Outcome Date GA Lbr Len/2nd Weight Sex Delivery Anes PTL Lv  1 Term 01/17/21 [redacted]w[redacted]d 10:50 / 01:10 8 lb 4 oz (3.742 kg) F Vag-Spont EPI  LIV    Past Medical History:  Diagnosis Date   Medical history non-contributory     Past Surgical History:  Procedure Laterality Date   WISDOM TOOTH EXTRACTION      Current Outpatient Medications on File Prior to Visit  Medication Sig Dispense Refill   acetaminophen (TYLENOL) 500 MG tablet Take 2 tablets (1,000 mg total) by mouth every 8 (eight) hours as needed (pain). 60 tablet 0   Ascorbic Acid (VITAMIN C PO) Take 1 tablet by mouth daily.     Cholecalciferol (D3 PO) Take 1 capsule by mouth daily.     ferrous sulfate 325 (65 FE) MG EC tablet Take 1 tablet (325 mg total) by mouth every other day. 30 tablet 1   ibuprofen (ADVIL) 600 MG tablet Take 1 tablet (600 mg total) by mouth every 6 (six) hours as needed (pain). 40 tablet 0   Prenatal Vit-Fe Fumarate-FA (PRENATAL PO) Take 1 tablet by mouth daily.     No current facility-administered medications on file prior to visit.    No Known Allergies  Social History   Socioeconomic History   Marital status: Single    Spouse name: Not on file   Number of children: Not on file   Years of education: Not on file   Highest education level: Not on file  Occupational History   Not on file   Tobacco Use   Smoking status: Never   Smokeless tobacco: Never  Vaping Use   Vaping Use: Never used  Substance and Sexual Activity   Alcohol use: Never   Drug use: Never   Sexual activity: Yes  Other Topics Concern   Not on file  Social History Narrative   Not on file   Social Determinants of Health   Financial Resource Strain: Not on file  Food Insecurity: Not on file  Transportation Needs: Not on file  Physical Activity: Not on file  Stress: Not on file  Social Connections: Not on file  Intimate Partner Violence: Not on file    History reviewed. No pertinent family history.  The following portions of the patient's history were reviewed and updated as appropriate: allergies, current medications, past family history, past medical history, past social history, past surgical history and problem list.  Review of Systems Pertinent items noted in HPI and remainder of comprehensive ROS otherwise negative.   Objective:  BP 126/88    Pulse 74    Wt 141 lb 4.8 oz (64.1 kg)    LMP 04/02/2021 (Exact Date)    Breastfeeding No    BMI 23.51 kg/m  CONSTITUTIONAL: Well-developed, well-nourished female in no acute distress.  HENT:  Normocephalic, atraumatic, External  right and left ear normal.  EYES: Conjunctivae and EOM are normal. Pupils are equal, round.  No scleral icterus.  NECK: Normal range of motion, supple, no masses.   SKIN: Skin is warm and dry. No rash noted. Not diaphoretic. No erythema. No pallor. NEUROLOGIC: Alert and oriented to person, place, and time. Normal reflexes, muscle tone coordination. No cranial nerve deficit noted. PSYCHIATRIC: Normal mood and affect. Normal behavior. Normal judgment and thought content. CARDIOVASCULAR: Normal heart rate noted, regular rhythm RESPIRATORY: Clear to auscultation bilaterally. Effort and breath sounds normal, no problems with respiration noted. BREASTS: deferred ABDOMEN: Soft, no distention noted.  No tenderness, rebound or  guarding.  PELVIC: deferred MUSCULOSKELETAL: Normal range of motion. No tenderness.  No cyanosis, clubbing, or edema.     Nexplanon Insertion Patient identified, informed consent performed, consent signed.   Appropriate time out taken. Nexplanon site identified.  Area prepped in usual sterile technique.  Nexplanon inserted by company guidelines.  There was minimal blood loss. There were no complications.   Steri-strips were applied over the small incision.  A pressure bandage was applied to reduce any bruising.  The patient tolerated the procedure well and was given post procedure instructions.  Patient is planning to use condoms for contraception for 2 weeks.   Assessment and Plan:  1. Encounter for annual routine gynecological examination Doing well since the birth Baby bottle feeding No urinary incontinence Has been using condoms for intercourse Pap due in approx one year  2. BMI 23.0-23.9, adult   3. Nexplanon insertion Advised no intercourse for 2 weeks to have system activated in her body. - etonogestrel (NEXPLANON) implant 68 mg    Routine preventative health maintenance measures emphasized. Please refer to After Visit Summary for other counseling recommendations.    Nolene Bernheim, RN, MSN, NP-BC Nurse Practitioner, [facility] for [facility]

## 2021-04-12 NOTE — Progress Notes (Signed)
Patient thinks she wants to get on birth control. She stated she wants a Nexplanon.   UPT was negative

## 2021-04-26 DIAGNOSIS — Z419 Encounter for procedure for purposes other than remedying health state, unspecified: Secondary | ICD-10-CM | POA: Diagnosis not present

## 2021-05-24 DIAGNOSIS — Z419 Encounter for procedure for purposes other than remedying health state, unspecified: Secondary | ICD-10-CM | POA: Diagnosis not present

## 2021-06-24 DIAGNOSIS — Z419 Encounter for procedure for purposes other than remedying health state, unspecified: Secondary | ICD-10-CM | POA: Diagnosis not present

## 2021-07-24 DIAGNOSIS — Z419 Encounter for procedure for purposes other than remedying health state, unspecified: Secondary | ICD-10-CM | POA: Diagnosis not present

## 2021-07-28 DIAGNOSIS — M109 Gout, unspecified: Secondary | ICD-10-CM | POA: Diagnosis not present

## 2021-07-28 DIAGNOSIS — M79674 Pain in right toe(s): Secondary | ICD-10-CM | POA: Diagnosis not present

## 2021-08-24 DIAGNOSIS — Z419 Encounter for procedure for purposes other than remedying health state, unspecified: Secondary | ICD-10-CM | POA: Diagnosis not present

## 2021-09-23 DIAGNOSIS — Z419 Encounter for procedure for purposes other than remedying health state, unspecified: Secondary | ICD-10-CM | POA: Diagnosis not present

## 2021-10-24 DIAGNOSIS — Z419 Encounter for procedure for purposes other than remedying health state, unspecified: Secondary | ICD-10-CM | POA: Diagnosis not present

## 2021-11-24 DIAGNOSIS — Z419 Encounter for procedure for purposes other than remedying health state, unspecified: Secondary | ICD-10-CM | POA: Diagnosis not present

## 2021-12-24 DIAGNOSIS — Z419 Encounter for procedure for purposes other than remedying health state, unspecified: Secondary | ICD-10-CM | POA: Diagnosis not present

## 2022-01-19 DIAGNOSIS — F4310 Post-traumatic stress disorder, unspecified: Secondary | ICD-10-CM | POA: Diagnosis not present

## 2022-01-24 DIAGNOSIS — Z419 Encounter for procedure for purposes other than remedying health state, unspecified: Secondary | ICD-10-CM | POA: Diagnosis not present

## 2022-02-02 DIAGNOSIS — F4310 Post-traumatic stress disorder, unspecified: Secondary | ICD-10-CM | POA: Diagnosis not present

## 2022-02-09 DIAGNOSIS — F4310 Post-traumatic stress disorder, unspecified: Secondary | ICD-10-CM | POA: Diagnosis not present

## 2022-02-13 DIAGNOSIS — F4310 Post-traumatic stress disorder, unspecified: Secondary | ICD-10-CM | POA: Diagnosis not present

## 2022-02-23 DIAGNOSIS — Z419 Encounter for procedure for purposes other than remedying health state, unspecified: Secondary | ICD-10-CM | POA: Diagnosis not present

## 2022-02-23 DIAGNOSIS — F4310 Post-traumatic stress disorder, unspecified: Secondary | ICD-10-CM | POA: Diagnosis not present

## 2022-03-02 DIAGNOSIS — F4310 Post-traumatic stress disorder, unspecified: Secondary | ICD-10-CM | POA: Diagnosis not present

## 2022-03-05 DIAGNOSIS — F4310 Post-traumatic stress disorder, unspecified: Secondary | ICD-10-CM | POA: Diagnosis not present

## 2022-03-13 DIAGNOSIS — F4310 Post-traumatic stress disorder, unspecified: Secondary | ICD-10-CM | POA: Diagnosis not present

## 2022-03-20 DIAGNOSIS — F4310 Post-traumatic stress disorder, unspecified: Secondary | ICD-10-CM | POA: Diagnosis not present

## 2022-03-26 DIAGNOSIS — Z419 Encounter for procedure for purposes other than remedying health state, unspecified: Secondary | ICD-10-CM | POA: Diagnosis not present

## 2022-04-26 DIAGNOSIS — Z419 Encounter for procedure for purposes other than remedying health state, unspecified: Secondary | ICD-10-CM | POA: Diagnosis not present

## 2022-05-25 DIAGNOSIS — Z419 Encounter for procedure for purposes other than remedying health state, unspecified: Secondary | ICD-10-CM | POA: Diagnosis not present

## 2022-06-10 IMAGING — DX DG CHEST 1V PORT
1 series · 1 of 1 positions shown · non-contrast
Comparison: None.

CLINICAL DATA: Shortness of breath

EXAM:
PORTABLE CHEST 1 VIEW

[chest ap]
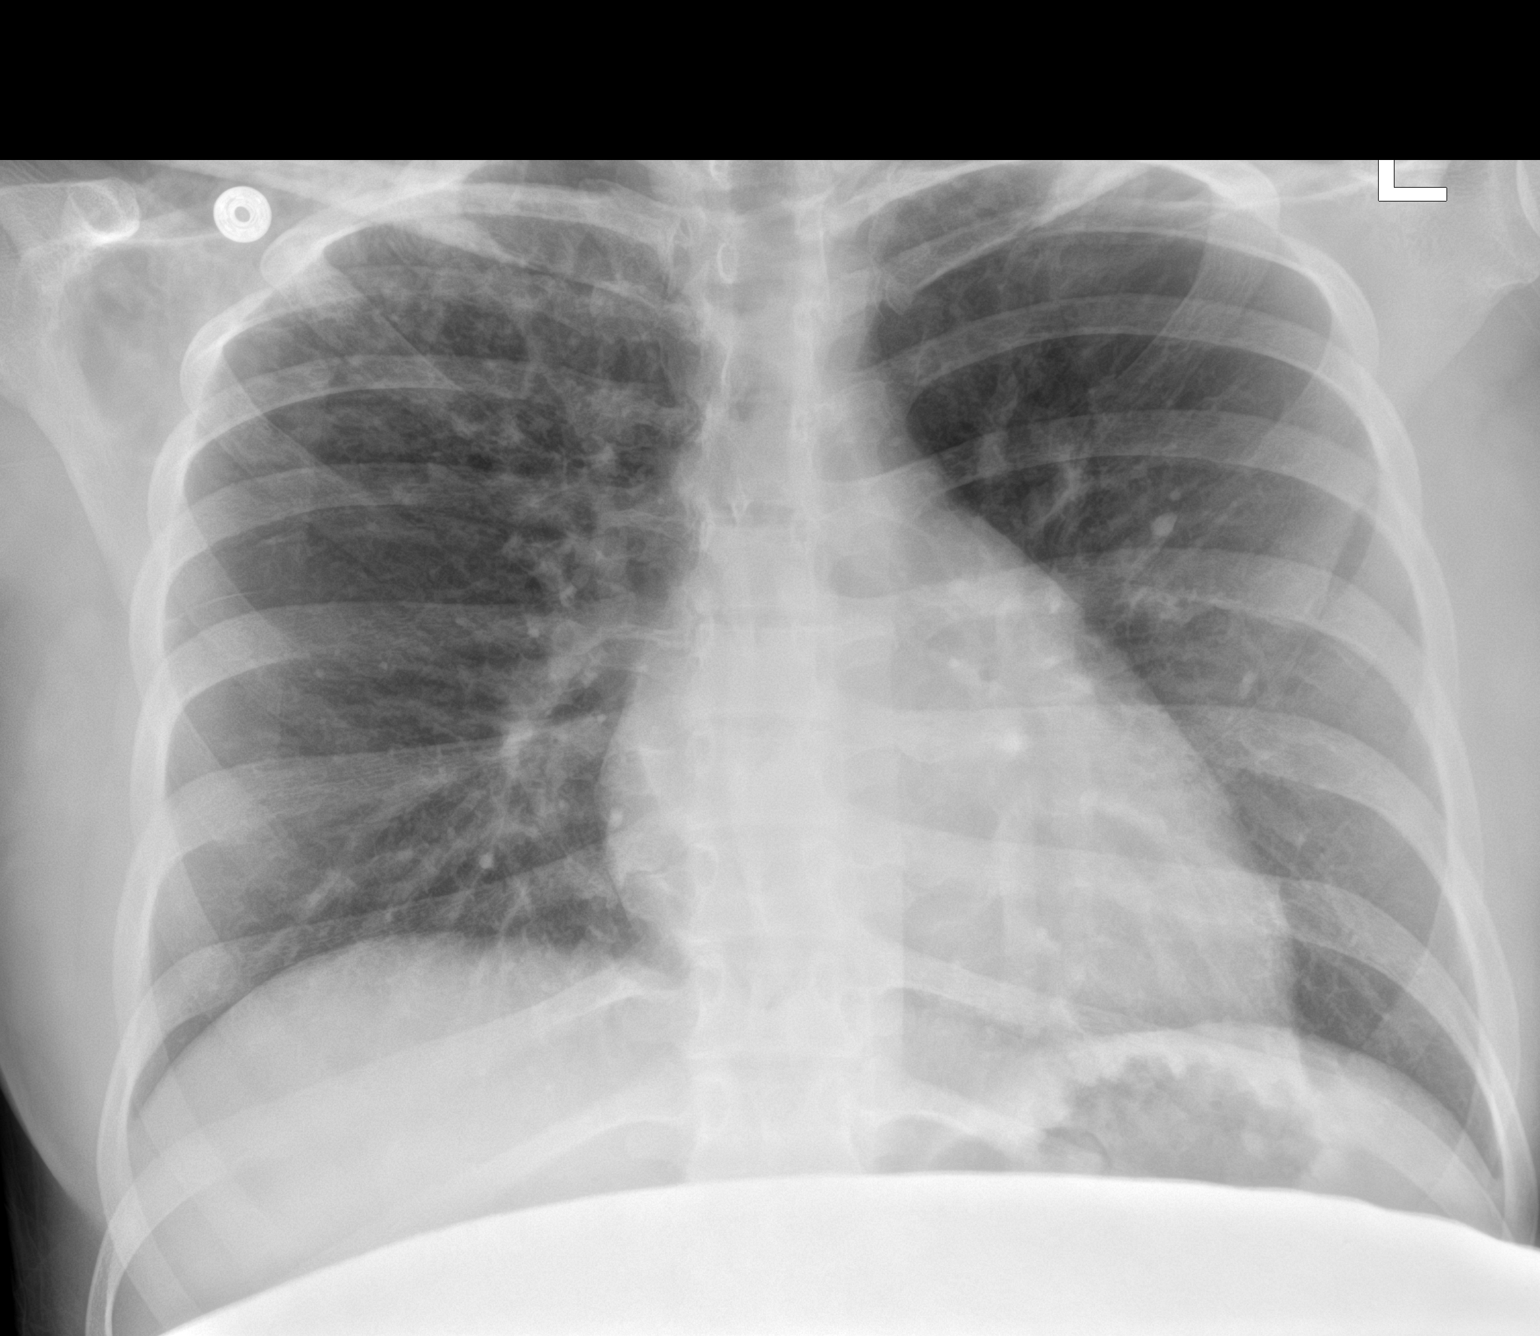

[1 of 1 positions shown; findings below may reference images not displayed]

FINDINGS: Heart is normal size. Left lung clear. Patchy opacities in the right
upper lobe and right lung base could reflect pneumonia. No effusions
or acute bony abnormality.
IMPRESSION: Patchy opacities in the right upper lobe and right lung base
concerning for pneumonia

## 2022-06-14 IMAGING — CT CT ANGIO CHEST
2 of 6 series · 18 of 46 positions shown · IV contrast (omnipaque)
Comparison: None

CLINICAL DATA: Pregnant, chest discomfort, shortness of breath,
current 14U3G-BA infection, signs and symptoms of DVT, question
pulmonary embolism

EXAM:
CT ANGIOGRAPHY CHEST WITH CONTRAST
TECHNIQUE: Multidetector CT imaging of the chest was performed using the
standard protocol during bolus administration of intravenous
contrast. Multiplanar CT image reconstructions and MIPs were
obtained to evaluate the vascular anatomy.
CONTRAST:  75mL OMNIPAQUE IOHEXOL 350 MG/ML SOLN IV

[Series 6: thins · axial · 0.58mm/px · z∈[+1216,+1422]mm · 15 of 227 slices shown]
[im 10/227  lung]
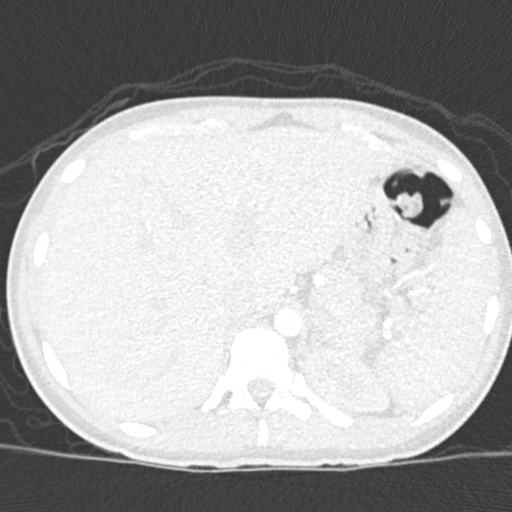
[im 30/227  soft-tissue]
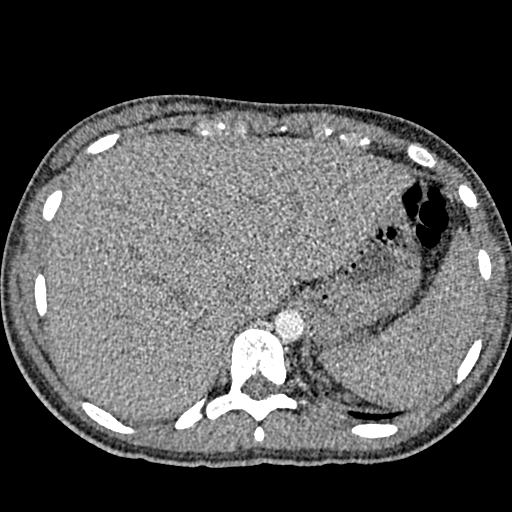
[im 40/227  lung]
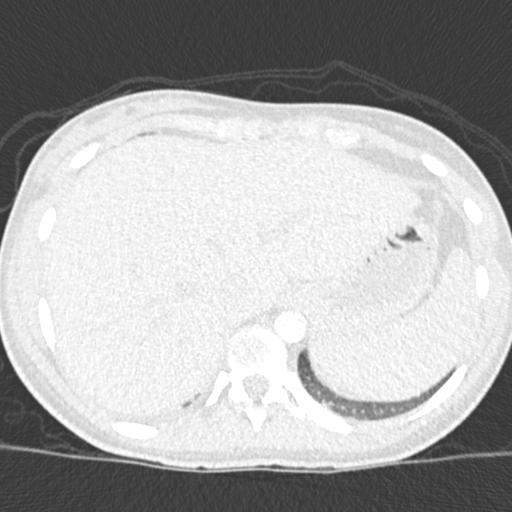
[im 59/227  soft-tissue]
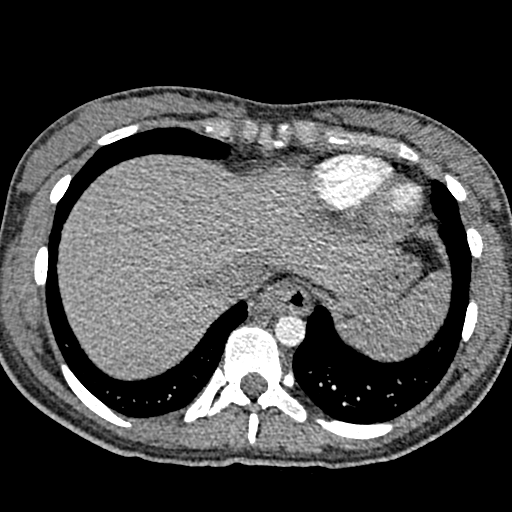
[im 69/227  lung]
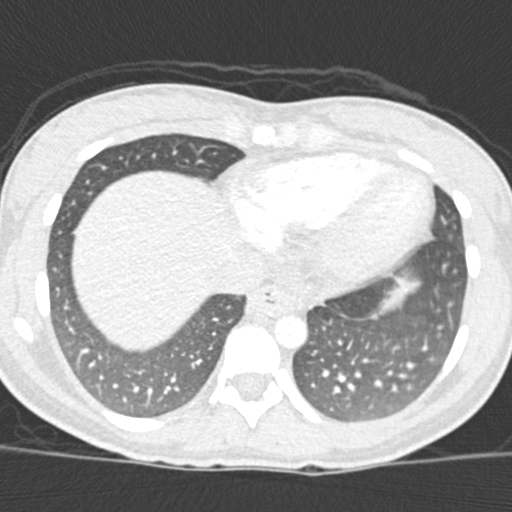
[im 89/227  soft-tissue]
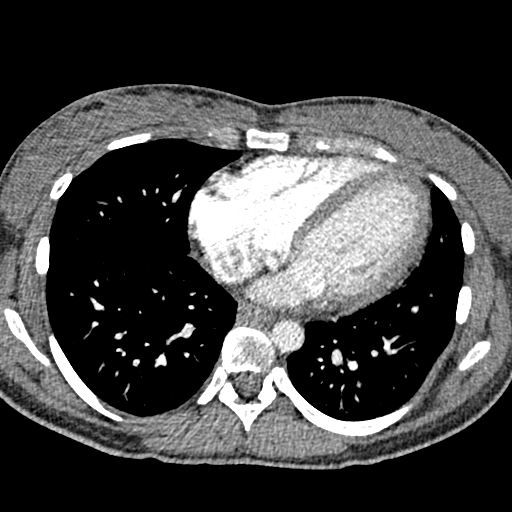
[im 99/227  lung]
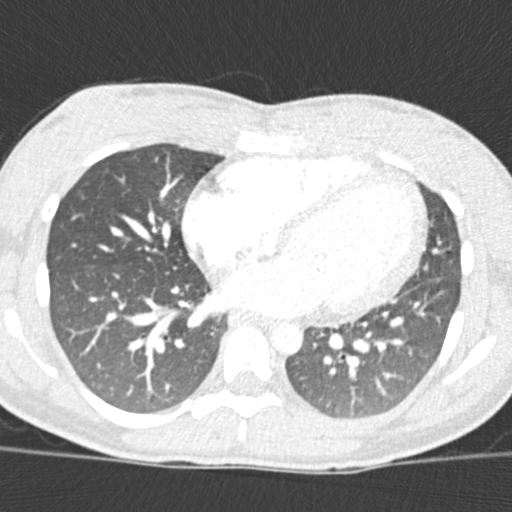
[im 118/227  soft-tissue]
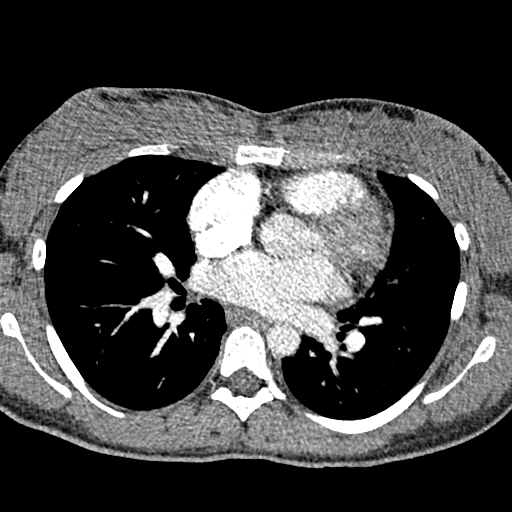
[im 128/227  lung]
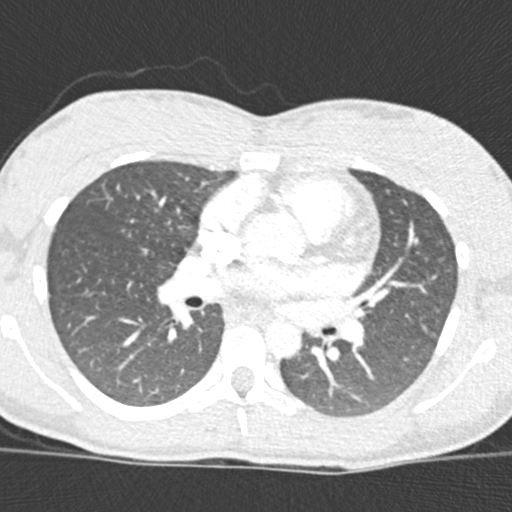
[im 138/227  soft-tissue]
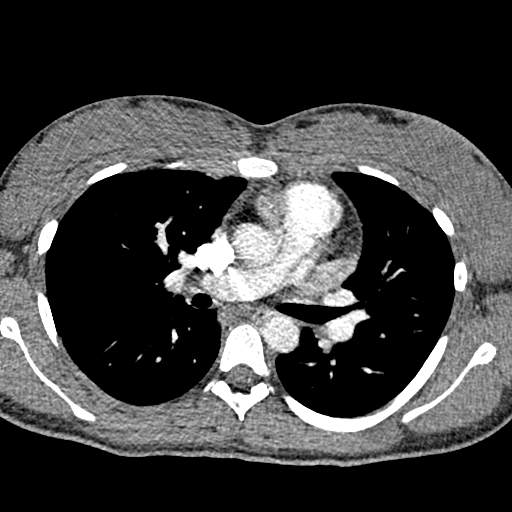
[im 158/227  lung]
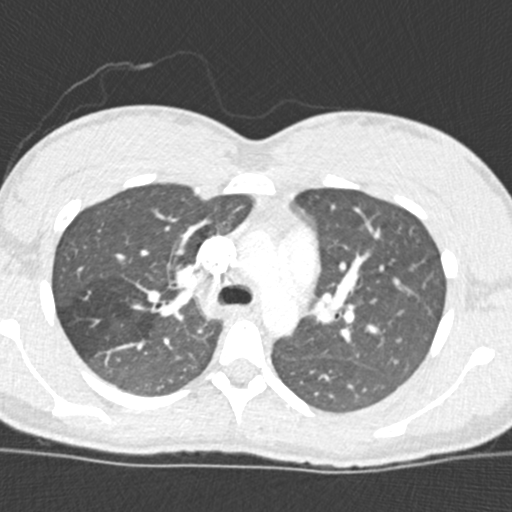
[im 168/227  soft-tissue]
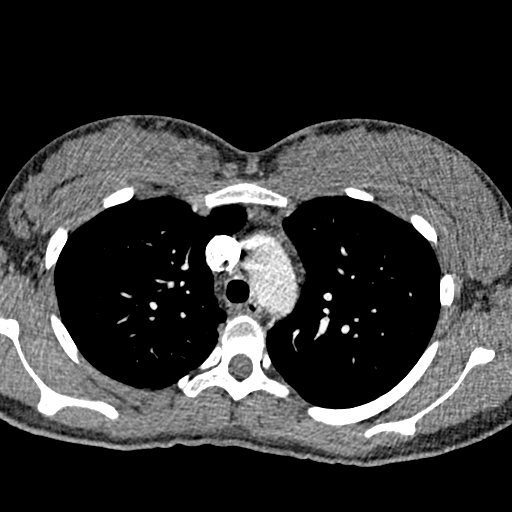
[im 187/227  lung]
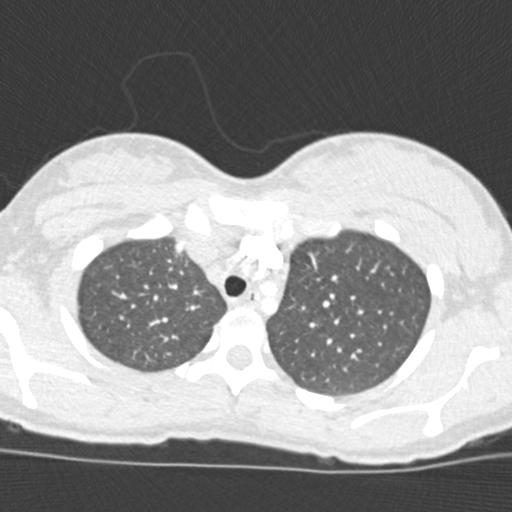
[im 197/227  soft-tissue]
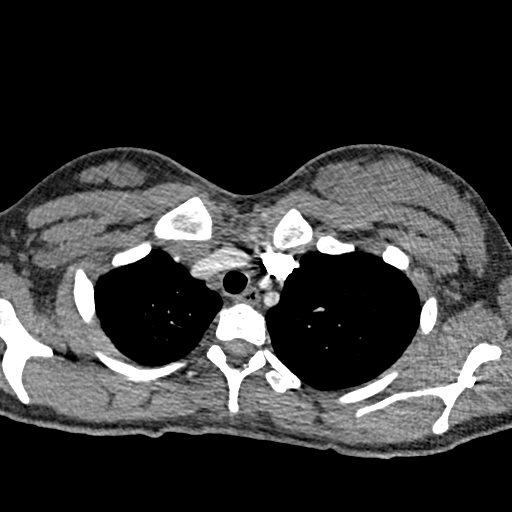
[im 217/227  lung]
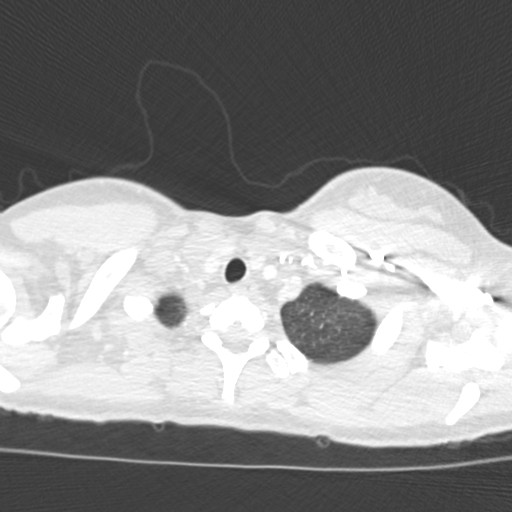

[Series 8: coronal mpr · coronal · 0.47mm/px · 3 of 108 slices shown]
[im 27/108  soft-tissue]
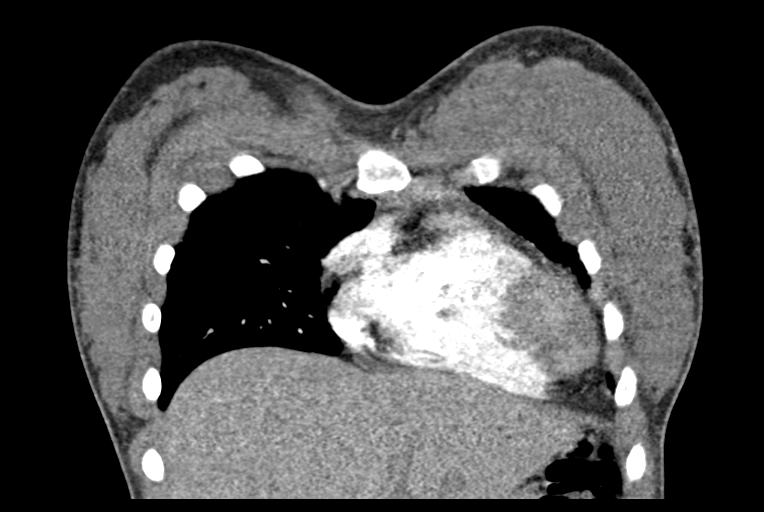
[im 54/108  soft-tissue]
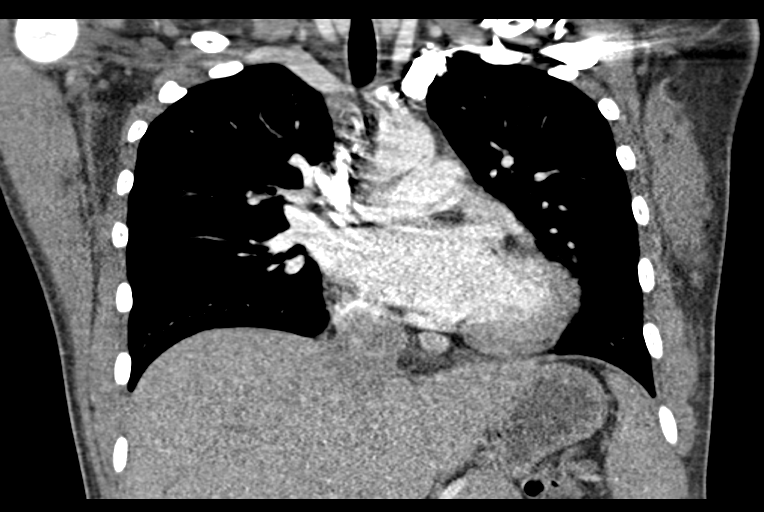
[im 81/108  soft-tissue]
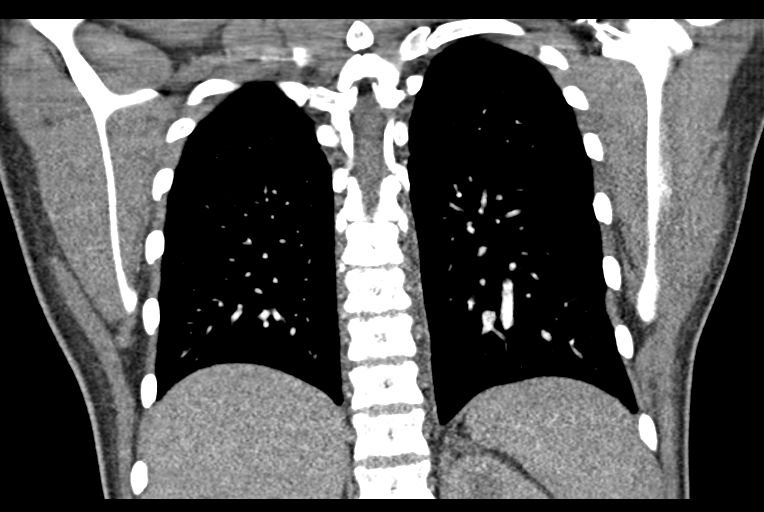

[18 of 46 positions shown; findings below may reference images not displayed]

FINDINGS: Cardiovascular: Aorta normal caliber without aneurysm or dissection.
Pulmonary arteries adequately opacified and patent. No evidence of
pulmonary embolism. Slightly prominent cardiac size consistent with
pregnancy. No pericardial effusion.

Mediastinum/Nodes: Esophagus unremarkable. Base of cervical region
normal appearance. No thoracic adenopathy.

Lungs/Pleura: Lungs clear. No pulmonary infiltrate, pleural
effusion, or pneumothorax.

Upper Abdomen: Visualized upper abdomen unremarkable

Musculoskeletal: Unremarkable

Review of the MIP images confirms the above findings.
IMPRESSION: Normal CT angio chest.

## 2022-06-25 DIAGNOSIS — Z419 Encounter for procedure for purposes other than remedying health state, unspecified: Secondary | ICD-10-CM | POA: Diagnosis not present

## 2022-07-25 DIAGNOSIS — Z419 Encounter for procedure for purposes other than remedying health state, unspecified: Secondary | ICD-10-CM | POA: Diagnosis not present

## 2022-08-25 DIAGNOSIS — Z419 Encounter for procedure for purposes other than remedying health state, unspecified: Secondary | ICD-10-CM | POA: Diagnosis not present

## 2022-09-24 DIAGNOSIS — Z419 Encounter for procedure for purposes other than remedying health state, unspecified: Secondary | ICD-10-CM | POA: Diagnosis not present

## 2022-10-25 DIAGNOSIS — Z419 Encounter for procedure for purposes other than remedying health state, unspecified: Secondary | ICD-10-CM | POA: Diagnosis not present

## 2022-11-25 DIAGNOSIS — Z419 Encounter for procedure for purposes other than remedying health state, unspecified: Secondary | ICD-10-CM | POA: Diagnosis not present

## 2022-12-25 DIAGNOSIS — Z419 Encounter for procedure for purposes other than remedying health state, unspecified: Secondary | ICD-10-CM | POA: Diagnosis not present

## 2023-01-25 DIAGNOSIS — Z419 Encounter for procedure for purposes other than remedying health state, unspecified: Secondary | ICD-10-CM | POA: Diagnosis not present

## 2023-02-24 DIAGNOSIS — Z419 Encounter for procedure for purposes other than remedying health state, unspecified: Secondary | ICD-10-CM | POA: Diagnosis not present

## 2023-03-27 DIAGNOSIS — Z419 Encounter for procedure for purposes other than remedying health state, unspecified: Secondary | ICD-10-CM | POA: Diagnosis not present

## 2023-04-07 DIAGNOSIS — Z419 Encounter for procedure for purposes other than remedying health state, unspecified: Secondary | ICD-10-CM | POA: Diagnosis not present

## 2023-04-27 DIAGNOSIS — Z419 Encounter for procedure for purposes other than remedying health state, unspecified: Secondary | ICD-10-CM | POA: Diagnosis not present

## 2023-05-08 DIAGNOSIS — Z419 Encounter for procedure for purposes other than remedying health state, unspecified: Secondary | ICD-10-CM | POA: Diagnosis not present

## 2023-05-25 DIAGNOSIS — Z419 Encounter for procedure for purposes other than remedying health state, unspecified: Secondary | ICD-10-CM | POA: Diagnosis not present

## 2023-06-05 DIAGNOSIS — Z419 Encounter for procedure for purposes other than remedying health state, unspecified: Secondary | ICD-10-CM | POA: Diagnosis not present

## 2023-07-06 DIAGNOSIS — Z419 Encounter for procedure for purposes other than remedying health state, unspecified: Secondary | ICD-10-CM | POA: Diagnosis not present

## 2023-08-05 DIAGNOSIS — Z419 Encounter for procedure for purposes other than remedying health state, unspecified: Secondary | ICD-10-CM | POA: Diagnosis not present

## 2023-09-05 DIAGNOSIS — Z419 Encounter for procedure for purposes other than remedying health state, unspecified: Secondary | ICD-10-CM | POA: Diagnosis not present

## 2023-09-15 DIAGNOSIS — M542 Cervicalgia: Secondary | ICD-10-CM | POA: Diagnosis not present

## 2023-09-15 DIAGNOSIS — F17200 Nicotine dependence, unspecified, uncomplicated: Secondary | ICD-10-CM | POA: Diagnosis not present

## 2023-09-15 DIAGNOSIS — J069 Acute upper respiratory infection, unspecified: Secondary | ICD-10-CM | POA: Diagnosis not present

## 2023-10-05 DIAGNOSIS — Z419 Encounter for procedure for purposes other than remedying health state, unspecified: Secondary | ICD-10-CM | POA: Diagnosis not present

## 2023-11-05 DIAGNOSIS — Z419 Encounter for procedure for purposes other than remedying health state, unspecified: Secondary | ICD-10-CM | POA: Diagnosis not present

## 2023-12-06 DIAGNOSIS — Z419 Encounter for procedure for purposes other than remedying health state, unspecified: Secondary | ICD-10-CM | POA: Diagnosis not present
